# Patient Record
Sex: Male | Born: 1945
Health system: Southern US, Community
[De-identification: ages and names within clinical notes are randomized; demographics above are authoritative.]

## PROBLEM LIST (undated history)

## (undated) DIAGNOSIS — F419 Anxiety disorder, unspecified: Secondary | ICD-10-CM

## (undated) DIAGNOSIS — R7301 Impaired fasting glucose: Secondary | ICD-10-CM

## (undated) DIAGNOSIS — R945 Abnormal results of liver function studies: Secondary | ICD-10-CM

## (undated) DIAGNOSIS — F1020 Alcohol dependence, uncomplicated: Secondary | ICD-10-CM

## (undated) DIAGNOSIS — K648 Other hemorrhoids: Secondary | ICD-10-CM

## (undated) DIAGNOSIS — K7031 Alcoholic cirrhosis of liver with ascites: Secondary | ICD-10-CM

## (undated) DIAGNOSIS — K5790 Diverticulosis of intestine, part unspecified, without perforation or abscess without bleeding: Secondary | ICD-10-CM

## (undated) DIAGNOSIS — IMO0001 Reserved for inherently not codable concepts without codable children: Secondary | ICD-10-CM

## (undated) DIAGNOSIS — D126 Benign neoplasm of colon, unspecified: Secondary | ICD-10-CM

## (undated) DIAGNOSIS — T7840XA Allergy, unspecified, initial encounter: Secondary | ICD-10-CM

## (undated) DIAGNOSIS — R7989 Other specified abnormal findings of blood chemistry: Secondary | ICD-10-CM

## (undated) HISTORY — PX: WRIST FRACTURE SURGERY: SHX121

## (undated) HISTORY — DX: Benign neoplasm of colon, unspecified: D12.6

## (undated) HISTORY — PX: POLYPECTOMY: SHX149

## (undated) HISTORY — PX: UPPER GASTROINTESTINAL ENDOSCOPY: SHX188

## (undated) HISTORY — PX: HERNIA REPAIR: SHX51

## (undated) HISTORY — DX: Diverticulosis of intestine, part unspecified, without perforation or abscess without bleeding: K57.90

## (undated) HISTORY — DX: Alcoholic cirrhosis of liver with ascites: K70.31

## (undated) HISTORY — DX: Allergy, unspecified, initial encounter: T78.40XA

## (undated) HISTORY — DX: Other hemorrhoids: K64.8

## (undated) SURGERY — EGD (ESOPHAGOGASTRODUODENOSCOPY)
Anesthesia: Moderate Sedation

---

## 2002-05-31 ENCOUNTER — Ambulatory Visit (HOSPITAL_BASED_OUTPATIENT_CLINIC_OR_DEPARTMENT_OTHER): Admission: RE | Admit: 2002-05-31 | Discharge: 2002-05-31 | Payer: Self-pay | Admitting: *Deleted

## 2009-06-05 ENCOUNTER — Encounter: Admission: RE | Admit: 2009-06-05 | Discharge: 2009-06-05 | Payer: Self-pay | Admitting: Orthopedic Surgery

## 2009-10-23 ENCOUNTER — Encounter: Payer: Self-pay | Admitting: Gastroenterology

## 2009-11-02 DIAGNOSIS — D126 Benign neoplasm of colon, unspecified: Secondary | ICD-10-CM

## 2009-11-02 HISTORY — PX: COLONOSCOPY: SHX174

## 2009-11-02 HISTORY — DX: Benign neoplasm of colon, unspecified: D12.6

## 2009-11-07 ENCOUNTER — Encounter (INDEPENDENT_AMBULATORY_CARE_PROVIDER_SITE_OTHER): Payer: Self-pay | Admitting: *Deleted

## 2009-11-12 ENCOUNTER — Encounter (INDEPENDENT_AMBULATORY_CARE_PROVIDER_SITE_OTHER): Payer: Self-pay | Admitting: *Deleted

## 2009-11-19 ENCOUNTER — Ambulatory Visit: Payer: Self-pay | Admitting: Gastroenterology

## 2009-11-28 ENCOUNTER — Ambulatory Visit: Payer: Self-pay | Admitting: Gastroenterology

## 2009-12-01 ENCOUNTER — Encounter: Payer: Self-pay | Admitting: Gastroenterology

## 2010-06-04 NOTE — Letter (Signed)
Summary: Previsit letter  [facility]  [address] [place], [place] [address]   Phone: [phone]  Fax: [phone]       11/07/2009 MRN: [id number]  [facility] [address] [place], [place]  [address]  Dear [name],  Welcome to the Gastroenterology Division at [facility].    You are scheduled to see a nurse for your pre-procedure visit on 11-14-09 at 3:30p.m. on the 3rd floor at [facility], [address]  We ask that you try to arrive at our office 15 minutes prior to your appointment time to allow for check-in.  Your nurse visit will consist of discussing your medical and surgical history, your immediate family medical history, and your medications.    Please bring a complete list of all your medications or, if you prefer, bring the medication bottles and we will list them.  We will need to be aware of both prescribed and over the counter drugs.  We will need to know exact dosage information as well.  If you are on blood thinners (Coumadin, Plavix, Aggrenox, Ticlid, etc.) please call our office today/prior to your appointment, as we need to consult with your physician about holding your medication.   Please be prepared to read and sign documents such as consent forms, a financial agreement, and acknowledgement forms.  If necessary, and with your consent, a friend or relative is welcome to sit-in on the nurse visit with you.  Please bring your insurance card so that we may make a copy of it.  If your insurance requires a referral to see a specialist, please bring your referral form from your primary care physician.  No co-pay is required for this nurse visit.     If you cannot keep your appointment, please call [phone] to cancel or reschedule prior to your appointment date.  This allows [place] the opportunity to schedule an appointment for another patient in need of care.    Thank you for choosing [facility] for your medical  needs.  We appreciate the opportunity to care for you.  Please visit [place] at our website  to learn more about our practice.                     Sincerely.                                                                                                                   The Gastroenterology Division

## 2010-06-04 NOTE — Miscellaneous (Signed)
Summary: [facility] PV  Clinical Lists Changes  Medications: Added new medication of MOVIPREP 100 GM  SOLR (PEG-KCL-NACL-NASULF-NA ASC-C) As per prep instructions. - Signed Rx of MOVIPREP 100 GM  SOLR (PEG-KCL-NACL-NASULF-NA ASC-C) As per prep instructions.;  #1 x 0;  Signed;  Entered by: Ezra Sites RN;  Authorized by: Meryl Dare MD [facility];  Method used: Electronically to [facility]  [address] [id number] [address], [place], [place]  [address], Ph: [phone] or [phone], Fax: [phone] Observations: Added new observation of NKA: T (11/19/2009 14:34)    Prescriptions: MOVIPREP 100 GM  SOLR (PEG-KCL-NACL-NASULF-NA ASC-C) As per prep instructions.  #1 x 0   Entered by:   Ezra Sites RN   Authorized by:   Meryl Dare MD [facility]   Signed by:   Ezra Sites RN on 11/19/2009   Method used:   Electronically to        [facility]  [address] [id number] ([facility]ail)       [address]       [place], [place]  [address]       Ph: [phone] or [phone]       Fax: [phone]   RxID:   [id number]

## 2010-06-04 NOTE — Letter (Signed)
Summary: [facility] Instructions  [facility]  [address] [place], [place] [address]   Phone: [phone]  Fax: [phone]       [name]    Jun 17, 1945    MRN: [id number]        Procedure Day Dorna Bloom:  Wednesday 11/28/2009     Arrival Time: 9:00 am      Procedure Time: 10:00 am     Location of Procedure:                    _x _  [facility] (4th Floor)                        PREPARATION FOR COLONOSCOPY WITH MOVIPREP   Starting 5 days prior to your procedure Friday 7/22 do not eat nuts, seeds, popcorn, corn, beans, peas,  salads, or any raw vegetables.  Do not take any fiber supplements (e.g. Metamucil, Citrucel, and Benefiber).  THE DAY BEFORE YOUR PROCEDURE         DATE: Tuesday 7/26  1.  Drink clear liquids the entire day-NO SOLID FOOD  2.  Do not drink anything colored red or purple.  Avoid juices with pulp.  No orange juice.  3.  Drink at least 64 oz. (8 glasses) of fluid/clear liquids during the day to prevent dehydration and help the prep work efficiently.  CLEAR LIQUIDS INCLUDE: Water Jello Ice Popsicles Tea (sugar ok, no milk/cream) Powdered fruit flavored drinks Coffee (sugar ok, no milk/cream) Gatorade Juice: apple, white grape, white cranberry  Lemonade Clear bullion, consomm, broth Carbonated beverages (any kind) Strained chicken noodle soup Hard Candy                             4.  In the morning, mix first dose of MoviPrep solution:    Empty 1 Pouch A and 1 Pouch B into the disposable container    Add lukewarm drinking water to the top line of the container. Mix to dissolve    Refrigerate (mixed solution should be used within 24 hrs)  5.  Begin drinking the prep at 5:00 p.m. The MoviPrep container is divided by 4 marks.   Every 15 minutes drink the solution down to the next mark (approximately 8 oz) until the full liter is complete.   6.  Follow completed prep with 16 oz of clear liquid of your choice  (Nothing red or purple).  Continue to drink clear liquids until bedtime.  7.  Before going to bed, mix second dose of MoviPrep solution:    Empty 1 Pouch A and 1 Pouch B into the disposable container    Add lukewarm drinking water to the top line of the container. Mix to dissolve    Refrigerate  THE DAY OF YOUR PROCEDURE      DATE: Wednesday 7/27  Beginning at 5:00 am (5 hours before procedure):         1. Every 15 minutes, drink the solution down to the next mark (approx 8 oz) until the full liter is complete.  2. Follow completed prep with 16 oz. of clear liquid of your choice.    3. You may drink clear liquids until 8:00 am (2 HOURS BEFORE PROCEDURE).   MEDICATION INSTRUCTIONS  Unless otherwise instructed, you should take regular prescription medications with a small sip of water   as early as possible the morning of  your procedure.          OTHER INSTRUCTIONS  You will need a responsible adult at least 66 years of age to accompany you and drive you home.   This person must remain in the waiting room during your procedure.  Wear loose fitting clothing that is easily removed.  Leave jewelry and other valuables at home.  However, you may wish to bring a book to read or  an iPod/MP3 player to listen to music as you wait for your procedure to start.  Remove all body piercing jewelry and leave at home.  Total time from sign-in until discharge is approximately 2-3 hours.  You should go home directly after your procedure and rest.  You can resume normal activities the  day after your procedure.  The day of your procedure you should not:   Drive   Make legal decisions   Operate machinery   Drink alcohol   Return to work  You will receive specific instructions about eating, activities and medications before you leave.    The above instructions have been reviewed and explained to me by   Ezra Sites RN  November 19, 2009 3:07 PM     I fully understand and can  verbalize these instructions _____________________________ Date _________

## 2010-06-04 NOTE — Procedures (Signed)
Summary: Colonoscopy  Patient: [name] Note: All result statuses are Final unless otherwise noted.  Tests: (1) Colonoscopy (COL)   COL Colonoscopy           DONE     [facility]     [address]     [place], [place]  [address]           COLONOSCOPY PROCEDURE REPORT           PATIENT:  [name], [name]  MR#:  [id number]     BIRTHDATE:  May 11, 1945, 64 yrs. old  GENDER:  male     ENDOSCOPIST:  Judie Petit T. Russella Dar, MD, [facility]     Referred by:  Kari Baars, M.D.     PROCEDURE DATE:  11/28/2009     PROCEDURE:  Colonoscopy with snare polypectomy     ASA CLASS:  Class II     INDICATIONS:  1) Routine Risk Screening     MEDICATIONS:   Fentanyl 100 mcg IV, Versed 10 mg IV     DESCRIPTION OF PROCEDURE:   After the risks benefits and     alternatives of the procedure were thoroughly explained, informed     consent was obtained.  Digital rectal exam was performed and     revealed no abnormalities.  The LB PCF-H180AL [id number] endoscope     was introduced through the anus and advanced to the cecum, which     was identified by both the appendix and ileocecal valve, without     limitations. The quality of the prep was adequate, using MoviPrep.     The instrument was then slowly withdrawn as the colon was fully     examined.     <<PROCEDUREIMAGES>>     FINDINGS:  A sessile polyp was found in the proximal transverse     colon. It was 5 mm in size. Polyp was snared without cautery.     Retrieval was successful. Two polyps were found in the sigmoid     colon. They were 4 mm in size. Polyps were snared without cautery.     Retrieval was successful.  Mild diverticulosis was found ascending     colon to descending colon. Moderate diverticulosis was found in     the sigmoid colon. This was otherwise a normal examination of the     colon.  Retroflexed views in the rectum revealed internal     hemorrhoids, moderate.  The time to cecum =  4  minutes. The scope     was then withdrawn (time =   14.5  min) from the patient and the     procedure completed.           COMPLICATIONS:  None           ENDOSCOPIC IMPRESSION:     1) 5 mm sessile polyp in the proximal transverse colon     2) 4 mm, two polyps in the sigmoid colon     3) Mild diverticulosis ascending colon to descending colon     4) Moderate diverticulosis in the sigmoid colon     5) Internal hemorrhoids           RECOMMENDATIONS:     1) Await pathology results     2) High fiber diet with liberal fluid intake.     3) If the polyps removed today are adenomatous (pre-cancerous),     you will need a repeat colonoscopy in 5 years. Otherwise you  should continue to follow colorectal cancer screening guidelines     for "routine risk" patients with colonoscopy in 10 years.     Venita Lick. Russella Dar, MD, [facility]           n.     eSIGNED:   Venita Lick. Stark at 11/28/2009 10:53 AM           [place], [id number]  Note: An exclamation mark (!) indicates a result that was not dispersed into the flowsheet. Document Creation Date: 11/28/2009 10:55 AM _______________________________________________________________________  (1) Order result status: Final Collection or observation date-time: 11/28/2009 10:47 Requested date-time:  Receipt date-time:  Reported date-time:  Referring Physician:   Ordering Physician: Claudette Head [phone]) Specimen Source:  Source: Launa Grill Order Number: [id number] Lab site:   Appended Document: Colonoscopy     Procedures Next Due Date:    Colonoscopy: 11/2014

## 2010-06-04 NOTE — Letter (Signed)
Summary: Patient Notice- Polyp Results  [facility]  [address] [place], [place] [address]   Phone: [phone]  Fax: [phone]        December 01, 2009 MRN: [id number]    [facility] [address] [place], [place]  [address]    Dear [name],  I am pleased to inform you that the colon polyp(s) removed during your recent colonoscopy was (were) found to be benign (no cancer detected) upon pathologic examination.  I recommend you have a repeat colonoscopy examination in 5 years to look for recurrent polyps, as having colon polyps increases your risk for having recurrent polyps or even colon cancer in the future.  Should you develop new or worsening symptoms of abdominal pain, bowel habit changes or bleeding from the rectum or bowels, please schedule an evaluation with either your primary care physician or with me.  Continue treatment plan as outlined the day of your exam.  Please call us if you are having persistent problems or have questions about your condition that have not been fully answered at this time.  Sincerely,  Meryl Dare MD [facility]  This letter has been electronically signed by your physician.  Appended Document: Patient Notice- Polyp Results Letter mailed 8.3.2011

## 2010-09-20 NOTE — Op Note (Signed)
[name], [name]                          ACCOUNT NO.:  [id number]   MEDICAL RECORD NO.:  [id number]                   PATIENT TYPE:  AMB   LOCATION:  DSC                                  FACILITY:  [facility]   PHYSICIAN:  [name], M.D.               DATE OF BIRTH:  1945-05-17   DATE OF PROCEDURE:  05/31/2002  DATE OF DISCHARGE:                                 OPERATIVE REPORT   PREOPERATIVE DIAGNOSIS:  Right inguinal hernia.   POSTOPERATIVE DIAGNOSIS:  Right inguinal hernia.   OPERATION/PROCEDURE:  Right inguinal herniorrhaphy with mesh.   ANESTHESIA:  Local, (1% Xylocaine without epinephrine, 0.25% Marcaine  without epinephrine and sodium bicarbonate) with anesthesia monitoring.   ANESTHESIOLOGIST:  CRNA.   DESCRIPTION OF PROCEDURE:  The patient was taken to the operating room and  placed on the table in the supine position.  The right lower quadrant of the  abdomen was prepped and draped in a sterile field.  An oblique incision was  outlined with the skin marker.  The local anesthetic was used to block the  ileoinguinal nerve in the area for the incision and then was used throughout  as necessary.  Once the ileoinguinal nerve was identified, it was blocked  directly.  The incision was taken through the skin and subcutaneous tissue  and subcutaneous bleeders then cauterized with the Bovie and ligated with 3-  0 Vicryl.  The external oblique aponeurosis was divided in line with its  fibers to and through the external ring.  The patient had a large  ileoinguinal nerve which was seen and protected.  He had a smaller nerve  more medially and superiorly which was also protected.  The cord structures  were isolated with a Penrose drain.  The patient was found to have a very  large direct hernia coming off inferior to the recurrent epigastric vessels.  There was no evidence of an indirect hernia.  The large direct hernia was  reduced and the floor of the inguinal canal was  embrocated with a running  suture of 0 chromic catgut.  It was also snugged up the internal ring so  that a Kelly clamp could be inserted along the cord structures.  A piece of  3 inch x 6 inch Atria mesh was then fashioned to cover the entire inguinal  floor to extend around the cord structures superiorly and lateral to the  internal ring.  The mesh was anchored inferiorly with a running suture of 2-  0 Novofil.  The first suture was placed in the pubic tubercle.  The other  suture was placed in the shelving edge of Poupart's ligament.  The mesh was  anchored superiorly and medially with interrupted sutures of 0 Novofil.  A  slit was made into the mesh to accommodate the cord structures of the  internal ring and then the mesh was sutured to itself superior and  lateral  to the internal ring with a 3-0 Vicryl suture.  The two nerves were seen and  protected.  The mesh seemed to be lying nicely covering the inguinal floor.  The cord structures and nerves were then returned to their normal anatomical  position and the external oblique aponeurosis reapproximated with  interrupted sutures of 3-0 Vicryl.  The tip of the index finger could be  inserted along the cord structures at the external inguinal ring.  Scarpa's  fascia was closed with 3-0 Vicryl  and the skin was closed with a running subcuticular suture of 4-0 Monocryl.  Benzoin and 0.5 inch Steri-Strips were used to reinforce the skin closure.  Dry, sterile dressing was applied.  The patient seemed to have tolerated the  procedure well, was taken to the PACU in satisfactory condition.                                                [name], M.D.    TBP/MEDQ  D:  05/31/2002  T:  05/31/2002  Job:  [id number]   cc:   [name], M.D.  [address]  [place]  [place] [address]  Fax: [phone]

## 2011-01-28 ENCOUNTER — Other Ambulatory Visit: Payer: Self-pay | Admitting: Internal Medicine

## 2011-01-30 ENCOUNTER — Other Ambulatory Visit: Payer: Self-pay

## 2011-02-04 ENCOUNTER — Ambulatory Visit
Admission: RE | Admit: 2011-02-04 | Discharge: 2011-02-04 | Disposition: A | Discharge status: Home / Self Care | Payer: Medicare Other | Source: Ambulatory Visit | Attending: Internal Medicine | Admitting: Internal Medicine

## 2011-02-04 DIAGNOSIS — R7989 Other specified abnormal findings of blood chemistry: Secondary | ICD-10-CM

## 2011-05-24 ENCOUNTER — Encounter (HOSPITAL_COMMUNITY): Payer: Self-pay | Admitting: *Deleted

## 2011-05-24 ENCOUNTER — Inpatient Hospital Stay (HOSPITAL_COMMUNITY)
Admission: EM | Admit: 2011-05-24 | Discharge: 2011-05-28 | DRG: 432 | Disposition: A | Discharge status: Home Health | Payer: Medicare Other | Attending: Internal Medicine | Admitting: Internal Medicine

## 2011-05-24 ENCOUNTER — Other Ambulatory Visit: Payer: Self-pay

## 2011-05-24 DIAGNOSIS — Z23 Encounter for immunization: Secondary | ICD-10-CM

## 2011-05-24 DIAGNOSIS — F1020 Alcohol dependence, uncomplicated: Secondary | ICD-10-CM | POA: Diagnosis present

## 2011-05-24 DIAGNOSIS — K7030 Alcoholic cirrhosis of liver without ascites: Secondary | ICD-10-CM | POA: Diagnosis not present

## 2011-05-24 DIAGNOSIS — E46 Unspecified protein-calorie malnutrition: Secondary | ICD-10-CM

## 2011-05-24 DIAGNOSIS — K828 Other specified diseases of gallbladder: Secondary | ICD-10-CM | POA: Diagnosis not present

## 2011-05-24 DIAGNOSIS — R195 Other fecal abnormalities: Secondary | ICD-10-CM | POA: Diagnosis present

## 2011-05-24 DIAGNOSIS — R627 Adult failure to thrive: Secondary | ICD-10-CM | POA: Diagnosis present

## 2011-05-24 DIAGNOSIS — R188 Other ascites: Secondary | ICD-10-CM | POA: Diagnosis not present

## 2011-05-24 DIAGNOSIS — F17200 Nicotine dependence, unspecified, uncomplicated: Secondary | ICD-10-CM | POA: Diagnosis present

## 2011-05-24 DIAGNOSIS — R197 Diarrhea, unspecified: Secondary | ICD-10-CM | POA: Diagnosis present

## 2011-05-24 DIAGNOSIS — K2990 Gastroduodenitis, unspecified, without bleeding: Secondary | ICD-10-CM | POA: Diagnosis present

## 2011-05-24 DIAGNOSIS — K589 Irritable bowel syndrome without diarrhea: Secondary | ICD-10-CM | POA: Diagnosis present

## 2011-05-24 DIAGNOSIS — K922 Gastrointestinal hemorrhage, unspecified: Secondary | ICD-10-CM

## 2011-05-24 DIAGNOSIS — F419 Anxiety disorder, unspecified: Secondary | ICD-10-CM | POA: Diagnosis present

## 2011-05-24 DIAGNOSIS — D696 Thrombocytopenia, unspecified: Secondary | ICD-10-CM | POA: Diagnosis present

## 2011-05-24 DIAGNOSIS — K8020 Calculus of gallbladder without cholecystitis without obstruction: Secondary | ICD-10-CM | POA: Diagnosis present

## 2011-05-24 DIAGNOSIS — E43 Unspecified severe protein-calorie malnutrition: Secondary | ICD-10-CM | POA: Diagnosis present

## 2011-05-24 DIAGNOSIS — Z79899 Other long term (current) drug therapy: Secondary | ICD-10-CM

## 2011-05-24 DIAGNOSIS — IMO0002 Reserved for concepts with insufficient information to code with codable children: Secondary | ICD-10-CM

## 2011-05-24 DIAGNOSIS — I498 Other specified cardiac arrhythmias: Secondary | ICD-10-CM | POA: Diagnosis not present

## 2011-05-24 DIAGNOSIS — K7460 Unspecified cirrhosis of liver: Secondary | ICD-10-CM | POA: Diagnosis not present

## 2011-05-24 DIAGNOSIS — E871 Hypo-osmolality and hyponatremia: Secondary | ICD-10-CM | POA: Diagnosis present

## 2011-05-24 DIAGNOSIS — K2970 Gastritis, unspecified, without bleeding: Secondary | ICD-10-CM | POA: Diagnosis present

## 2011-05-24 DIAGNOSIS — F411 Generalized anxiety disorder: Secondary | ICD-10-CM | POA: Diagnosis present

## 2011-05-24 HISTORY — DX: Alcohol dependence, uncomplicated: F10.20

## 2011-05-24 HISTORY — DX: Anxiety disorder, unspecified: F41.9

## 2011-05-24 HISTORY — DX: Abnormal results of liver function studies: R94.5

## 2011-05-24 HISTORY — DX: Reserved for inherently not codable concepts without codable children: IMO0001

## 2011-05-24 HISTORY — DX: Impaired fasting glucose: R73.01

## 2011-05-24 HISTORY — DX: Other specified abnormal findings of blood chemistry: R79.89

## 2011-05-24 LAB — DIFFERENTIAL
Basophils Absolute: 0.1 10*3/uL (ref 0.0–0.1)
Eosinophils Absolute: 0 10*3/uL (ref 0.0–0.7)
Eosinophils Relative: 0 % (ref 0–5)
Lymphocytes Relative: 15 % (ref 12–46)
Lymphs Abs: 1.5 10*3/uL (ref 0.7–4.0)
Monocytes Absolute: 1.2 10*3/uL — ABNORMAL HIGH (ref 0.1–1.0)
Monocytes Relative: 13 % — ABNORMAL HIGH (ref 3–12)
Neutro Abs: 7.1 10*3/uL (ref 1.7–7.7)
Neutrophils Relative %: 72 % (ref 43–77)

## 2011-05-24 LAB — LACTIC ACID, PLASMA: Lactic Acid, Venous: 1.6 mmol/L (ref 0.5–2.2)

## 2011-05-24 LAB — CBC
Hemoglobin: 13.9 g/dL (ref 13.0–17.0)
MCH: 33.1 pg (ref 26.0–34.0)
RBC: 4.2 MIL/uL — ABNORMAL LOW (ref 4.22–5.81)
WBC: 9.9 10*3/uL (ref 4.0–10.5)

## 2011-05-24 LAB — COMPREHENSIVE METABOLIC PANEL
ALT: 38 U/L (ref 0–53)
AST: 78 U/L — ABNORMAL HIGH (ref 0–37)
Albumin: 2.7 g/dL — ABNORMAL LOW (ref 3.5–5.2)
Alkaline Phosphatase: 169 U/L — ABNORMAL HIGH (ref 39–117)
Calcium: 8.6 mg/dL (ref 8.4–10.5)
Total Protein: 7.1 g/dL (ref 6.0–8.3)

## 2011-05-24 LAB — OCCULT BLOOD, POC DEVICE: Fecal Occult Bld: POSITIVE

## 2011-05-24 NOTE — ED Notes (Signed)
Patient states that his appetite has been poor for 3 weeks.  He has has abdominal distention and RUQ pain for the same time frame.  Denies alcohol abuse.  States that his normal intake is 2 glasses of wine per evening.  Denies use of statins.  Denies hepatitis.

## 2011-05-24 NOTE — ED Notes (Signed)
Patient with intermittent abdominal pain for about three week.  Today his pain was worse and not getting any better.  Has been taking a lot of pepto-bismol without any relief.  Patient also c/o being weak and dizzy.  Abdomen is tight and had BM this was which was small and hard and tarry.

## 2011-05-24 NOTE — ED Notes (Signed)
EKG in event log at 2329 was a duplicate.  See other EKG for results.

## 2011-05-24 NOTE — ED Notes (Signed)
Patient reports 3 days ago noticed abdomen swelling rates burning pain 9/10. Abdomen distended denies tenderness.

## 2011-05-24 NOTE — ED Notes (Signed)
Patient has bilateral pitting edema.

## 2011-05-25 ENCOUNTER — Encounter (HOSPITAL_COMMUNITY): Payer: Self-pay | Admitting: Internal Medicine

## 2011-05-25 ENCOUNTER — Inpatient Hospital Stay (HOSPITAL_COMMUNITY): Payer: Medicare Other

## 2011-05-25 ENCOUNTER — Emergency Department (HOSPITAL_COMMUNITY): Payer: Medicare Other

## 2011-05-25 DIAGNOSIS — E46 Unspecified protein-calorie malnutrition: Secondary | ICD-10-CM

## 2011-05-25 DIAGNOSIS — R195 Other fecal abnormalities: Secondary | ICD-10-CM | POA: Diagnosis not present

## 2011-05-25 DIAGNOSIS — R899 Unspecified abnormal finding in specimens from other organs, systems and tissues: Secondary | ICD-10-CM | POA: Diagnosis not present

## 2011-05-25 DIAGNOSIS — R188 Other ascites: Secondary | ICD-10-CM | POA: Diagnosis not present

## 2011-05-25 DIAGNOSIS — F419 Anxiety disorder, unspecified: Secondary | ICD-10-CM | POA: Diagnosis present

## 2011-05-25 DIAGNOSIS — K7030 Alcoholic cirrhosis of liver without ascites: Principal | ICD-10-CM

## 2011-05-25 DIAGNOSIS — E43 Unspecified severe protein-calorie malnutrition: Secondary | ICD-10-CM | POA: Diagnosis present

## 2011-05-25 DIAGNOSIS — F1020 Alcohol dependence, uncomplicated: Secondary | ICD-10-CM | POA: Diagnosis present

## 2011-05-25 DIAGNOSIS — K7460 Unspecified cirrhosis of liver: Secondary | ICD-10-CM | POA: Diagnosis not present

## 2011-05-25 LAB — URINALYSIS, ROUTINE W REFLEX MICROSCOPIC
Hgb urine dipstick: NEGATIVE
Ketones, ur: 15 mg/dL — AB
Nitrite: POSITIVE — AB
Protein, ur: NEGATIVE mg/dL
Specific Gravity, Urine: 1.021 (ref 1.005–1.030)
Urobilinogen, UA: 1 mg/dL (ref 0.0–1.0)
pH: 5.5 (ref 5.0–8.0)

## 2011-05-25 LAB — COMPREHENSIVE METABOLIC PANEL
AST: 62 U/L — ABNORMAL HIGH (ref 0–37)
CO2: 24 mEq/L (ref 19–32)
Calcium: 7.9 mg/dL — ABNORMAL LOW (ref 8.4–10.5)
Creatinine, Ser: 0.39 mg/dL — ABNORMAL LOW (ref 0.50–1.35)
GFR calc Af Amer: >90 mL/min (ref >90)
GFR calc non Af Amer: >90 mL/min (ref >90)
Total Protein: 5.9 g/dL — ABNORMAL LOW (ref 6.0–8.3)

## 2011-05-25 LAB — BODY FLUID CELL COUNT WITH DIFFERENTIAL: Neutrophil Count, Fluid: 15 % (ref 0–25)

## 2011-05-25 LAB — CBC
Hemoglobin: 11.9 g/dL — ABNORMAL LOW (ref 13.0–17.0)
MCH: 32.5 pg (ref 26.0–34.0)
RBC: 3.66 MIL/uL — ABNORMAL LOW (ref 4.22–5.81)

## 2011-05-25 LAB — LACTATE DEHYDROGENASE, PLEURAL OR PERITONEAL FLUID: LD, Fluid: 56 U/L — ABNORMAL HIGH (ref 3–23)

## 2011-05-25 LAB — PROTEIN, BODY FLUID: Total protein, fluid: 1.6 g/dL

## 2011-05-25 LAB — HEPATITIS C ANTIBODY: HCV Ab: NEGATIVE

## 2011-05-25 LAB — URINE MICROSCOPIC-ADD ON

## 2011-05-25 LAB — FERRITIN: Ferritin: 857 ng/mL — ABNORMAL HIGH (ref 22–322)

## 2011-05-25 LAB — APTT: aPTT: 33 seconds (ref 24–37)

## 2011-05-25 LAB — TSH: TSH: 2.563 u[IU]/mL (ref 0.350–4.500)

## 2011-05-25 LAB — AMMONIA: Ammonia: 67 umol/L — ABNORMAL HIGH (ref 11–60)

## 2011-05-25 LAB — ALBUMIN, FLUID (OTHER)

## 2011-05-25 MED ORDER — SPIRONOLACTONE 25 MG PO TABS
25.0000 mg | ORAL_TABLET | Freq: Two times a day (BID) | ORAL | Status: DC
  Administered 2011-05-25 – 2011-05-28 (×6): 25 mg via ORAL
  Filled 2011-05-25 (×9): qty 1

## 2011-05-25 MED ORDER — PANTOPRAZOLE SODIUM 40 MG PO TBEC
40.0000 mg | DELAYED_RELEASE_TABLET | Freq: Two times a day (BID) | ORAL | Status: DC
  Administered 2011-05-25 – 2011-05-28 (×7): 40 mg via ORAL
  Filled 2011-05-25 (×6): qty 1

## 2011-05-25 MED ORDER — SODIUM CHLORIDE 0.9 % IJ SOLN
3.0000 mL | Freq: Two times a day (BID) | INTRAMUSCULAR | Status: DC
Start: 2011-05-25 — End: 2011-05-28
  Administered 2011-05-25 – 2011-05-28 (×6): 3 mL via INTRAVENOUS

## 2011-05-25 MED ORDER — ONDANSETRON HCL 4 MG/2ML IJ SOLN
4.0000 mg | Freq: Once | INTRAMUSCULAR | Status: AC
  Administered 2011-05-25: 4 mg via INTRAVENOUS
  Filled 2011-05-25: qty 2

## 2011-05-25 MED ORDER — ONDANSETRON HCL 4 MG/2ML IJ SOLN: 4.0000 mg | Freq: Four times a day (QID) | INTRAMUSCULAR | Status: DC | PRN

## 2011-05-25 MED ORDER — SODIUM CHLORIDE 0.9 % IV SOLN: 250.0000 mL | INTRAVENOUS | Status: DC | PRN

## 2011-05-25 MED ORDER — THIAMINE HCL 100 MG/ML IJ SOLN
INTRAVENOUS | Status: AC
  Administered 2011-05-25 – 2011-05-27 (×3): via INTRAVENOUS
  Filled 2011-05-25 (×5): qty 1000

## 2011-05-25 MED ORDER — ALPRAZOLAM 0.5 MG PO TABS
0.5000 mg | ORAL_TABLET | Freq: Three times a day (TID) | ORAL | Status: DC | PRN
  Administered 2011-05-25 – 2011-05-27 (×3): 0.5 mg via ORAL
  Filled 2011-05-25 (×3): qty 1

## 2011-05-25 MED ORDER — FENTANYL CITRATE 0.05 MG/ML IJ SOLN
25.0000 ug | Freq: Once | INTRAMUSCULAR | Status: AC
  Administered 2011-05-25: 25 ug via INTRAVENOUS
  Filled 2011-05-25: qty 2

## 2011-05-25 MED ORDER — ONDANSETRON HCL 4 MG PO TABS: 4.0000 mg | ORAL_TABLET | Freq: Four times a day (QID) | ORAL | Status: DC | PRN

## 2011-05-25 MED ORDER — SODIUM CHLORIDE 0.9 % IJ SOLN: 3.0000 mL | INTRAMUSCULAR | Status: DC | PRN

## 2011-05-25 MED ORDER — INFLUENZA VIRUS VACC SPLIT PF IM SUSP
0.5000 mL | INTRAMUSCULAR | Status: AC
  Administered 2011-05-25: 0.5 mL via INTRAMUSCULAR
  Filled 2011-05-25 (×2): qty 0.5

## 2011-05-25 MED ORDER — MORPHINE SULFATE 2 MG/ML IJ SOLN
1.0000 mg | INTRAMUSCULAR | Status: DC | PRN
  Administered 2011-05-25 (×3): 1 mg via INTRAVENOUS
  Filled 2011-05-25 (×4): qty 1

## 2011-05-25 MED ORDER — OXYCODONE HCL 5 MG PO TABS
5.0000 mg | ORAL_TABLET | ORAL | Status: DC | PRN
  Administered 2011-05-25: 5 mg via ORAL
  Filled 2011-05-25 (×2): qty 1

## 2011-05-25 NOTE — H&P (Signed)
PCP:  Kari Baars, MD  Chief Complaint:  Weakness, abdominal pain  HPI: [name] is a 66 year old white male with a history of alcoholism, anxiety disorder, and elevated liver function test who presented to the emergency department today with the complaint of abdominal pain and weakness. The patient reports severe diarrhea over the past 4-6 months which has been debilitating. He has noticed an increase in generalized weakness and unsteady gait associated with this. About 3 weeks ago he noticed a significant increase in abdominal girth associated with mild abdominal discomfort. Despite the increase in abdominal girth he has lost about 20-30 pounds. He called his daughter tonight stating that he felt like he needed to go to the hospital due to the severity of his weakness and increasing abdominal symptoms. In addition, he reports dark tarry stools off and on for the past several months. He denies any hematemesis, nausea/vomiting, fever/chills.  He states that he has been tapering his alcohol use and endorses 2-3 glasses of wine per day. Of note, his liver function tests were elevated in 9/12 (alkaline phosphatase around 300, AST around 170, and ALT around 60). He underwent an abdominal ultrasound at that time that showed fatty liver and gallstones which were uncomplicated.  He was advised to discontinue alcohol use and return for repeat liver function test in one month. Unfortunately, he has not been seen since that time and has continued to drink alcohol.    Review of Systems:  Review of Systems - all systems were reviewed with the patient are negative except in history of present illness with following exceptions: Unsteady gait when walking with recurrent falls, lower extremity swelling, bilateral leg weakness, hoarseness.    Past Medical History: Anxiety Disorder Elevated LFTs due to ETOH Elevated PSA, transient Impaired Fasting Glucose "White Coat" HTN   Medications: Prior to Admission  medications   Medication Sig Start Date End Date Taking? Authorizing Provider  ALPRAZolam Prudy Feeler) 0.5 MG tablet Take 0.5 mg by mouth 3 (three) times daily as needed. For anxiety   Yes Historical Provider, MD  bismuth subsalicylate (PEPTO BISMOL) 262 MG/15ML suspension Take 15 mLs by mouth every 6 (six) hours as needed. 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 PERRL, EOM's intact.  Nose: Nares normal. Septum midline. Mucosa normal. No drainage or sinus tenderness. Throat: lips, mucosa, and tongue normal; teeth and gums normal Neck: no adenopathy, no carotid bruit, thyroid not enlarged, symmetric, no tenderness/mass/nodules; positive hepatojugular reflux Resp: Decreased breath sounds bilateral bases Cardio: regular rate and rhythm, S1, S2 normal, no murmur, click, rub or gallop GI: Markedly distended abdomen with tense ascites; bulging flanks and shifting dullness; positive capit medusae; minimal diffuse tenderness  without rebound or guarding; no apparent hepatomegaly Extremities: extremities normal, atraumatic, no cyanosis; 2+ bilateral lower extremity pitting edema  Pulses: 2+ and symmetric Skin: Telangiectasias  over his face and trunk; ecchymoses and "liver spots" over his arms Lymph nodes: Cervical adenopathy: no cervical lymphadenopathy Neurologic: Alert and oriented X 3, normal strength and tone. Normal symmetric reflexes.  Rectal: Heme positive stool per ER physician, Dr. Dierdre Highman    Labs on Admission:   [facility] 05/24/11 2315  NA 128*  K 3.9  CL 94*  CO2 25  GLUCOSE 139*  BUN 13  CREATININE 0.48*  CALCIUM 8.6  MG --  PHOS --    Basename 05/24/11 2315  AST 78*  ALT 38  ALKPHOS 169*  BILITOT 1.9*  PROT 7.1  ALBUMIN 2.7*    Basename 05/24/11 2315  LIPASE 27  AMYLASE --    Basename 05/24/11 2315  WBC 9.9  NEUTROABS 7.1  HGB 13.9  HCT 40.4  MCV 96.2  PLT 153    No results found for this basename: INR, PROTIME    Radiological Exams on Admission: No results found. Orders placed during the hospital encounter of 05/24/11  . EKG 12-LEAD  . EKG 12-LEAD  . ED EKG  . ED EKG    Assessment/Plan Principal Problem: 1. *Cirrhosis- new onset ascites and skin findings (telangiectasias, Caput medusa) are consistent with cirrhosis, most likely due to alcohol in a patient with known chronic alcohol use and chronically elevated liver function tests (AST>ALT).  Will obtain hepatitis B and C serologies, ANA, and iron profile to rule out other less likely etiologies.  Abdominal ultrasound has been obtained and is pending at this time. Consider CT scan for further evaluation. We'll plan on ultrasound-guided paracentesis for diagnostic and therapeutic purposes in the morning. We'll consult GI to help with management and to rule out esophageal varices in the setting of his heme positive stools. He does not have any evidence of encephalopathy but we'll check an ammonia level. INR to rule out coagulopathy is also pending.   Active Problems: 2. Ascites- we'll perform diagnostic and therapeutic paracentesis in the morning. We'll start low-dose spironolactone 25 mg twice a day and consider  Lasix for fluid management. 3. Heme positive stool- we'll start on a PPI twice a day. GI consult to consider endoscopy to rule out varices. 4. Alcoholism-  Serum alcohol level is pending at this time.  Will place on CIWA protocol due to high risk of alcohol withdrawal. I have strongly advised him to cease all alcohol use. We'll give daily "rally pack" vitamin supplementation. 5. Diarrhea- maybe do to pancreatic insufficiency. We'll monitor and consider Pancrease supplementation. 6. Anxiety disorder-continue Xanax. Monitor for alcohol withdrawal. 7. Failure to thrive- PT/OT evaluation due to his recurrent fall risk. May need to consider short-term rehabilitation.   Shyloh Derosa,[name] 05/25/2011, 12:05 AM

## 2011-05-25 NOTE — ED Notes (Signed)
Patient transported to Ultrasound 

## 2011-05-25 NOTE — ED Provider Notes (Signed)
History     CSN: [id number]  Arrival date & time 05/24/11  2105   First MD Initiated Contact with Patient 05/24/11 2255      Chief Complaint  Patient presents with  . Abdominal Pain    (Consider location/radiation/quality/duration/timing/severity/associated sxs/prior treatment) The history is provided by the patient.   diffuse abdominal pain with increase for girth worse or last few weeks but much more so last 24 hours. He also notice black tarry stools today. She has history of alcoholism denies any recent alcohol abuse. He denies any fevers. He denies any history of same. Moderate in severity. Quality described as fullness and soreness. No radiation of pain. Duration intermittent until today has been constant. Moderate to severe. No nausea or vomiting. No previous treatment for the same. No known history of cirrhosis  History reviewed. No pertinent past medical history.  History reviewed. No pertinent past surgical history.  History reviewed. No pertinent family history.  History  Substance Use Topics  . Smoking status: Current Some Day Smoker    Types: Cigarettes  . Smokeless tobacco: Not on file  . Alcohol Use: No      Review of Systems  Constitutional: Negative for fever and chills.  HENT: Negative for neck pain and neck stiffness.   Eyes: Negative for pain.  Respiratory: Negative for shortness of breath.   Cardiovascular: Negative for chest pain.  Gastrointestinal: Positive for abdominal pain and abdominal distention. Negative for anal bleeding and rectal pain.  Genitourinary: Negative for dysuria.  Musculoskeletal: Negative for back pain.  Skin: Negative for rash.  Neurological: Negative for headaches.  All other systems reviewed and are negative.    Allergies  Review of patient's allergies indicates no known allergies.  Home Medications   Current Outpatient Rx  Name Route Sig Dispense Refill  . ALPRAZOLAM 0.5 MG PO TABS Oral Take 0.5 mg by mouth 3  (three) times daily as needed. For anxiety    . BISMUTH SUBSALICYLATE 262 MG/15ML PO SUSP Oral Take 15 mLs by mouth every 6 (six) hours as needed. For upset stomach      BP 133/92  Pulse 107  Temp(Src) 97.8 F (36.6 C) (Oral)  Resp 19  SpO2 100%  Physical Exam  Constitutional: He is oriented to person, place, and time. He appears well-developed and well-nourished.  HENT:  Head: Normocephalic and atraumatic.  Eyes: Conjunctivae and EOM are normal. Pupils are equal, round, and reactive to light. No scleral icterus.  Neck: Trachea normal. Neck supple. No thyromegaly present.  Cardiovascular: Normal rate, regular rhythm, S1 normal, S2 normal and normal pulses.     No systolic murmur is present   No diastolic murmur is present  Pulses:      Radial pulses are 2+ on the right side, and 2+ on the left side.  Pulmonary/Chest: Effort normal and breath sounds normal. He has no wheezes. He has no rhonchi. He has no rales. He exhibits no tenderness.  Abdominal: Normal appearance. There is no CVA tenderness and negative Murphy's sign.       Abdomen distended with ascites fluid wave present. Mild diffuse tenderness. Hypoactive bowel sounds. No guarding or peritonitis  Musculoskeletal:       BLE:s Calves nontender, no cords or erythema, negative Homans sign  Neurological: He is alert and oriented to person, place, and time. He has normal strength. No cranial nerve deficit or sensory deficit. GCS eye subscore is 4. GCS verbal subscore is 5. GCS motor subscore is 6.  Skin:  Skin is warm and dry. No rash noted. He is not diaphoretic.  Psychiatric: His speech is normal.       Cooperative and appropriate    ED Course  Procedures (including critical care time)  Results for orders placed during the hospital encounter of 05/24/11  CBC      Component Value Range   WBC 9.9  4.0 - 10.5 (K/uL)   RBC 4.20 (*) 4.22 - 5.81 (MIL/uL)   Hemoglobin 13.9  13.0 - 17.0 (g/dL)   HCT 16.1  09.6 - 04.5 (%)   MCV  96.2  78.0 - 100.0 (fL)   MCH 33.1  26.0 - 34.0 (pg)   MCHC 34.4  30.0 - 36.0 (g/dL)   RDW 40.9  81.1 - 91.4 (%)   Platelets 153  150 - 400 (K/uL)  DIFFERENTIAL      Component Value Range   Neutrophils Relative 72  43 - 77 (%)   Neutro Abs 7.1  1.7 - 7.7 (K/uL)   Lymphocytes Relative 15  12 - 46 (%)   Lymphs Abs 1.5  0.7 - 4.0 (K/uL)   Monocytes Relative 13 (*) 3 - 12 (%)   Monocytes Absolute 1.2 (*) 0.1 - 1.0 (K/uL)   Eosinophils Relative 0  0 - 5 (%)   Eosinophils Absolute 0.0  0.0 - 0.7 (K/uL)   Basophils Relative 1  0 - 1 (%)   Basophils Absolute 0.1  0.0 - 0.1 (K/uL)  COMPREHENSIVE METABOLIC PANEL      Component Value Range   Sodium 128 (*) 135 - 145 (mEq/L)   Potassium 3.9  3.5 - 5.1 (mEq/L)   Chloride 94 (*) 96 - 112 (mEq/L)   CO2 25  19 - 32 (mEq/L)   Glucose, Bld 139 (*) 70 - 99 (mg/dL)   BUN 13  6 - 23 (mg/dL)   Creatinine, Ser 7.82 (*) 0.50 - 1.35 (mg/dL)   Calcium 8.6  8.4 - 95.6 (mg/dL)   Total Protein 7.1  6.0 - 8.3 (g/dL)   Albumin 2.7 (*) 3.5 - 5.2 (g/dL)   AST 78 (*) 0 - 37 (U/L)   ALT 38  0 - 53 (U/L)   Alkaline Phosphatase 169 (*) 39 - 117 (U/L)   Total Bilirubin 1.9 (*) 0.3 - 1.2 (mg/dL)   GFR calc non Af Amer >90  >90 (mL/min)   GFR calc Af Amer >90  >90 (mL/min)  LIPASE, BLOOD      Component Value Range   Lipase 27  11 - 59 (U/L)  LACTIC ACID, PLASMA      Component Value Range   Lactic Acid, Venous 1.6  0.5 - 2.2 (mmol/L)  OCCULT BLOOD, POC DEVICE      Component Value Range   Fecal Occult Bld POSITIVE    PROTIME-INR      Component Value Range   Prothrombin Time 16.3 (*) 11.6 - 15.2 (seconds)   INR 1.29  0.00 - 1.49   APTT      Component Value Range   aPTT 33  24 - 37 (seconds)  ETHANOL      Component Value Range   Alcohol, Ethyl (B) <11  0 - 11 (mg/dL)     Case d/w Dr Clelia Croft who knows patient and will eval in ED for admit new onset ascites, ABD pain and GI bleeding.   Date: 05/25/2011  Rate: 109  Rhythm: sinus tachycardia  QRS Axis:  normal  Intervals: normal  ST/T Wave abnormalities: nonspecific ST/T changes  Conduction Disutrbances:Incomplete right bundle-branch block  Narrative Interpretation:   Old EKG Reviewed: none available    MDM   Abdominal pain with new onset ascites and a history of alcoholism likely cirrhosis. Labs obtained and reviewed as above. Primary care physician Dr. Clelia Croft involved in evaluated patient in the ED for admission.        Sunnie Nielsen, MD 05/25/11 [phone]

## 2011-05-25 NOTE — Consult Note (Signed)
[name] Consultation  Requesting Provider: Kari Baars, MD Primary Care Physician:  Kari Baars, MD Primary Gastroenterologist:  Dr. Claudette Head  Reason for Consultation:  Heme positive stool in the setting of alcoholic cirrhosis  Assessment:  Heme + stool, no anemia or clear bleeding (dark stools probably     Pepto-Bismol) Cause could be hemorrhoids seen on colonoscopy     11/2009 but he could also have portal gastropathy or varices     Alcoholic  cirrhosis - MELD = 11 with close to 100%       three-month survival. Minimal hepatic encephalopathy possible.     Elevated ferritin and iron sat 32% consistent with alcoholic disease. He    has significant ascites. Studies are pending. He has hyponatremia as    well.     Malnourished moderate to severe     I suspect chronic loose stools are from alcohol and possibly IBS   Recommendations: EGD is reasonable to evaluate heme + stool and check for varices will    keep NPO and likely be able to schedule tomorrow with Dr. Marina Goodell The    risks and benefits as well as alternatives of endoscopic procedure(s) have    been discussed and reviewed. All questions answered. The patient agrees to    proceed.       He should have Hepatitis A IgG testing and Hepatitis B S Ab      testing and if not immune then should receive vaccinations given liver    disease - I ordered           Check B12  Levels - is getting folic acid supplements      May benefit from Xifaxan for ? Minimal encephalopathy will reassess    and decide, would avoid lactulose now with loose stools     He seems to have stopped alcohol but a rehab program would make    sense if he will go. I have explained importance of cessation and     increased health risks of cirrhosis     Spironolactone started. Hyponatremia may limit the use of that. Close    followup will be needed. Sodium restriction is in diet.        No signs of tumor on ultrasound but alpha-fetoprotein is  appropriate.        HPI: [name] is a 66 y.o. White admitted yesterday with increasing abdominal girth and lower extremity edema and diffuse weakness. He has a long history of alcohol use. He was having loose stools and diarrhea for several months. He tells me he quit drinking about one month ago. The diarrhea has improved some since then. Stools have been dark but we has been using Pepto-Bismol at times. He has lost 20-30 pounds despite the increase in abdominal girth and edema. He has not had any rectal bleeding or hematemesis or nausea or vomiting or fevers. He told Dr. Clelia Croft that he was drinking 2-3 glasses of wine a day recently. Alcohol level was not detectable on admission. He was seen in September of last year by Dr. Clelia Croft and was told to stop drinking, an ultrasound showed fatty liver and gallstones then. That was his last contact with Dr. Clelia Croft. Today he underwent a 4 L paracentesis, he feels much better, the results of that are pending at this time. He remained weak.   Past Medical History  Diagnosis Date  . Elevated liver function tests   . Anxiety disorder   . Alcoholism   .  White coat hypertension   . Impaired fasting glucose     Past Surgical History  Procedure Date  . Hernia repair     Prior to Admission medications   Medication Sig Start Date End Date Taking? Authorizing Provider  ALPRAZolam Prudy Feeler) 0.5 MG tablet Take 0.5 mg by mouth 3 (three) times daily as needed. For anxiety   Yes Historical Provider, MD  bismuth subsalicylate (PEPTO BISMOL) 262 MG/15ML suspension Take 15 mLs by mouth every 6 (six) hours as needed. For upset stomach   Yes Historical Provider, MD    Current Facility-Administered Medications  Medication Dose Route Frequency Provider Last Rate Last Dose  . 0.9 %  sodium chloride infusion  250 mL Intravenous PRN Kari Baars, MD      . ALPRAZolam Prudy Feeler) tablet 0.5 mg  0.5 mg Oral TID PRN Kari Baars, MD   0.5 mg at 05/25/11 0318  . fentaNYL  (SUBLIMAZE) injection 25 mcg  25 mcg Intravenous Once Sunnie Nielsen, MD   25 mcg at 05/25/11 0015  . influenza  inactive virus vaccine (FLUZONE/FLUARIX) injection 0.5 mL  0.5 mL Intramuscular Tomorrow-[address] Buren Kos, MD      . morphine 2 MG/ML injection 1 mg  1 mg Intravenous Q4H PRN Kari Baars, MD   1 mg at 05/25/11 0443  . ondansetron (ZOFRAN) injection 4 mg  4 mg Intravenous Once Sunnie Nielsen, MD   4 mg at 05/25/11 0009  . ondansetron (ZOFRAN) tablet 4 mg  4 mg Oral Q6H PRN Kari Baars, MD       Or  . ondansetron [facility]) injection 4 mg  4 mg Intravenous Q6H PRN Kari Baars, MD      . oxyCODONE (Oxy IR/ROXICODONE) immediate release tablet 5 mg  5 mg Oral Q4H PRN Kari Baars, MD   5 mg at 05/25/11 0318  . pantoprazole (PROTONIX) EC tablet 40 mg  40 mg Oral BID AC Kari Baars, MD   40 mg at 05/25/11 0848  . sodium chloride 0.9 % 1,000 mL with thiamine 100 mg, folic acid 1 mg, multivitamins adult 10 mL infusion   Intravenous Q24H Kari Baars, MD 50 mL/hr at 05/25/11 0318    . sodium chloride 0.9 % injection 3 mL  3 mL Intravenous Q12H W Buren Kos, MD   3 mL at 05/25/11 1131  . sodium chloride 0.9 % injection 3 mL  3 mL Intravenous PRN Kari Baars, MD      . spironolactone (ALDACTONE) tablet 25 mg  25 mg Oral BID Kari Baars, MD   25 mg at 05/25/11 0848    Allergies as of 05/24/2011  . (No Known Allergies)    Family hx Father- Lymphoma (d91)  Mother- d71 AD  GM- CHF  Bro- healthy  Daughter- Melanoma X 2  Daughter- healthy  History   Social History  . Marital Status: Married lives alone    Spouse Name: N/A    Number of Children: 2  . Years of Education: Masters   Occupational History  . retired     Audiological scientist   Social History Main Topics  . Smoking status: Current Some Day Smoker -- 0.2 packs/day for 40 years    Types: Cigarettes  . Smokeless tobacco: Never Used  . Alcohol Use: 1.8 oz/week    14-21 Drinks containing 0.5 oz  of alcohol, 3 Glasses of wine per week  . Drug Use: No  . 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 No fevers, chills. Mild cough, voice change. All other review of systems negative except as mentioned in the HPI.  Physical Exam: Vital signs in last 24 hours: Temp:  [97.8 F (36.6 C)-98.2 F (36.8 C)] 98 F (36.7 C) (01/20 0857) Pulse Rate:  [66-111] 104  (01/20 0857) Resp:  [18-21] 20  (01/20 0857) BP: (97-133)/(59-92) 102/59 mmHg (01/20 1048) SpO2:  [94 %-100 %] 95 % (01/20 1048) Weight:  [154 lb 8.7 oz (70.1 kg)] 154 lb 8.7 oz (70.1 kg) (01/20 0207) Last BM Date: 05/23/11 General:   Chronically ill and gaunt appearing elderly white man Eyes:  Sclera clear, no icterus.   Conjunctiva pink. Mouth:  Oropharynx pink & moist. Teeth in poor condition, some missing, those remaining in bed repair Neck:  Supple; no masses or thyromegaly. Lungs:  Decreased breath sounds at the right base otherwise clear  Heart:  Regular rate and rhythm; no murmurs, clicks, rubs,  or gallops. Abdomen:  Soft, he is just status post 4 L paracentesis. Ascites still present and moderate amount. Prominent veins in the abdominal wall. There is no hernia though there is faint erythema around the superior aspect the umbilicus. No hepatosplenomegaly detected. Nontender. Bowel sounds are present. Right inguinal hernia scar it present. Rectal:   Inspection of the perianal area shows some brown stool around the anus. Extremities:  Plus bilateral lower extremity edema extends to the mid pretibial area. Neurologic:  Alert and  oriented x 3, speech is slow but overall fairly clear. No  asterixis. Skin:  Brown hyperpigmented spots in the pretibial area, spider angiomata in the upper trunk Lymph Nodes:  No significant cervical or supraclavicular adenopathy. Psych: Flat affect Musculoskeletal: Diffuse muscle wasting in the extremities and upper chest wall. He looks gaunt. He does not have gynecomastia  Intake/Output from previous day: 01/19 0701 - 01/20 0700 In: 305 [P.O.:120; I.V.:185] Out: 0    Lab Results:  Basename 05/25/11 0300 05/24/11 2315  WBC 9.0 9.9  HGB 11.9* 13.9  HCT 35.0* 40.4  PLT 136* 153   BMET  Basename 05/25/11 0300 05/24/11 2315  NA 128* 128*  K 3.6 3.9  CL 96 94*  CO2 24 25  GLUCOSE 142* 139*  BUN 13 13  CREATININE 0.39* 0.48*  CALCIUM 7.9* 8.6   LFT  Basename 05/25/11 0300  PROT 5.9*  ALBUMIN 2.1*  AST 62*  ALT 30  ALKPHOS 138*  BILITOT 1.5*  BILIDIR --  IBILI --   PT/INR  Basename 05/25/11 0001  LABPROT 16.3*  INR 1.29      Studies/Results: US Abdomen Complete  05/25/2011  *RADIOLOGY REPORT*  Clinical Data:  Abdominal distention.  History of fatty liver. Evaluate liver.  COMPLETE ABDOMINAL ULTRASOUND  Comparison:  02/04/2011  Findings:  Gallbladder:  Multiple echogenic foci within the gallbladder compatible with small stones.  Bladder wall is thickened at 6 mm. Negative sonographic Murphy's.  Common bile duct:   Normal, 4 mm.  Liver:  Increased echotexture throughout the liver.  Nodular contours to the liver surface.  Findings compatible with cirrhosis. Associated moderate ascites.  No biliary ductal dilatation.  IVC:  Not well visualized due to overlying bowel gas.  Pancreas:  Not well visualized due to overlying bowel gas.  Spleen:  Normal size, 8 cm in craniocaudal length.  Right Kidney:   Normal in size and parenchymal echogenicity.  No evidence of mass or hydronephrosis.  Left Kidney:  Normal in size and parenchymal echogenicity.  No evidence of mass or hydronephrosis.  Abdominal aorta:  Not visualized due to  overlying bowel gas.  IMPRESSION: Changes of cirrhosis within the liver.  Associated moderate ascites.  Cholelithiasis.  Gallbladder wall thickening may be related to liver disease, chronic cholecystitis, or a combination.  No changes of acute cholecystitis.  Midline structures difficult to visualize due to overlying bowel gas.  Original Report Authenticated By: Cyndie Chime, M.D.   US Paracentesis  05/25/2011  *RADIOLOGY REPORT*  Clinical Data: Cirrhosis and ascites.  ULTRASOUND GUIDED PARACENTESIS  Comparison:  05/25/2011  An ultrasound guided paracentesis was thoroughly discussed with the patient and questions answered.  The benefits, risks, alternatives and complications were also discussed.  The patient understands and wishes to proceed with the procedure.  Written consent was obtained.  Ultrasound was performed to localize and mark an adequate pocket of fluid in the right lower quadrant of the abdomen.  The area was then prepped and draped in the normal sterile fashion.  1% Lidocaine was used for local anesthesia.  Under ultrasound guidance a 19 gauge Yueh catheter was introduced.  Paracentesis was performed.  The catheter was removed and a dressing applied.  Complications:  None  Findings:  A total of approximately 4.18 liters of clear yellow fluid was removed.  A fluid sample was sent for laboratory analysis.  IMPRESSION: Successful ultrasound guided paracentesis yielding 4.18 liters of ascites.  Original Report Authenticated By: Richarda Overlie, M.D.     Previous Endoscopies: 11/2009 colonoscopy 1) 5 mm sessile polyp in the proximal transverse colon - adenoma 2) 4 mm, two polyps in the sigmoid colon - adenomas 3) Mild diverticulosis ascending colon to descending colon  4) Moderate diverticulosis in the sigmoid colon  5) Internal hemorrhoids     LOS: 1 day      Iva Boop, MD, [facility] [phone] (pager) 05/25/2011 12:00 PM

## 2011-05-25 NOTE — ED Notes (Signed)
Advised MD of hyponatremia.

## 2011-05-25 NOTE — Procedures (Signed)
US guided paracentesis.  Removed 4.18 liters of fluid.

## 2011-05-25 NOTE — ED Notes (Signed)
Called and gave report to [place].  Advised patient has been unable to provide a urine sample.

## 2011-05-25 NOTE — Progress Notes (Signed)
Subjective: Feels better.  Still feels like abdomen is tense.    Objective: Vital signs in last 24 hours: Temp:  [97.8 F (36.6 C)-98.2 F (36.8 C)] 97.9 F (36.6 C) (01/20 0609) Pulse Rate:  [66-111] 98  (01/20 0609) Resp:  [18-21] 18  (01/20 0609) BP: (99-133)/(68-92) 99/68 mmHg (01/20 0609) SpO2:  [96 %-100 %] 96 % (01/20 0609) Weight:  [70.1 kg (154 lb 8.7 oz)] 70.1 kg (154 lb 8.7 oz) (01/20 0207) Weight change:     CBG (last 3)  No results found for this basename: GLUCAP:3 in the last 72 hours  Intake/Output from previous day:   Intake/Output this shift:    General appearance: chronically ill, cachectic, classic cirrhotic findings Eyes: no scleral icterus Throat: oropharynx moist without erythema Resp: decreased breath sounds bilateral bases Cardio: regular rate and rhythm, S1, S2 normal, no murmur, click, rub or gallop GI: distended with tense ascites; no rebound or guarding Extremities: no clubbing, cyanosis; 2+ bilateral lower extremity edema Neurologic: slight dysarthria, no asterixis; alert and oriented X 4 Psych: no tremor or excessive anxiety  Lab Results:  Basename 05/25/11 0300 05/24/11 2315  NA 128* 128*  K 3.6 3.9  CL 96 94*  CO2 24 25  GLUCOSE 142* 139*  BUN 13 13  CREATININE 0.39* 0.48*  CALCIUM 7.9* 8.6  MG -- --  PHOS -- --    Basename 05/25/11 0300 05/24/11 2315  AST 62* 78*  ALT 30 38  ALKPHOS 138* 169*  BILITOT 1.5* 1.9*  PROT 5.9* 7.1  ALBUMIN 2.1* 2.7*    Basename 05/25/11 0300 05/24/11 2315  WBC 9.0 9.9  NEUTROABS -- 7.1  HGB 11.9* 13.9  HCT 35.0* 40.4  MCV 95.6 96.2  PLT 136* 153   Lab Results  Component Value Date   INR 1.29 05/25/2011   ETOH level <2 Ammonia 67   Studies/Results: US Abdomen Complete  05/25/2011  *RADIOLOGY REPORT*  Clinical Data:  Abdominal distention.  History of fatty liver. Evaluate liver.  COMPLETE ABDOMINAL ULTRASOUND  Comparison:  02/04/2011  Findings:  Gallbladder:  Multiple echogenic foci  within the gallbladder compatible with small stones.  Bladder wall is thickened at 6 mm. Negative sonographic Murphy's.  Common bile duct:   Normal, 4 mm.  Liver:  Increased echotexture throughout the liver.  Nodular contours to the liver surface.  Findings compatible with cirrhosis. Associated moderate ascites.  No biliary ductal dilatation.  IVC:  Not well visualized due to overlying bowel gas.  Pancreas:  Not well visualized due to overlying bowel gas.  Spleen:  Normal size, 8 cm in craniocaudal length.  Right Kidney:   Normal in size and parenchymal echogenicity.  No evidence of mass or hydronephrosis.  Left Kidney:  Normal in size and parenchymal echogenicity.  No evidence of mass or hydronephrosis.  Abdominal aorta:  Not visualized due to overlying bowel gas.  IMPRESSION: Changes of cirrhosis within the liver.  Associated moderate ascites.  Cholelithiasis.  Gallbladder wall thickening may be related to liver disease, chronic cholecystitis, or a combination.  No changes of acute cholecystitis.  Midline structures difficult to visualize due to overlying bowel gas.  Original Report Authenticated By: Cyndie Chime, M.D.     Medications: Scheduled:   . fentaNYL  25 mcg Intravenous Once  . influenza  inactive virus vaccine  0.5 mL Intramuscular Tomorrow-1000  . ondansetron  4 mg Intravenous Once  . pantoprazole  40 mg Oral BID AC  . general admission iv infusion  Intravenous Q24H  . sodium chloride  3 mL Intravenous Q12H  . spironolactone  25 mg Oral BID   Continuous:   Assessment/Plan: Principal Problem: 1. *Cirrhosis- diagnostic/therapeutic paracentesis today.  Started spironolactone- will adjust dose as tolerated.  GI consult requested.  ANA, Hepatitis serologies, iron profile pending.  Child's Class B based on current parameters. Active Problems: 2.Ascites- paracentesis today as above.   3. Heme positive stool- GI consult pending for probable EGD to rule out varices.  Continue PPI bid. 4.  Alcoholism- ETOH level undetectable.  He reports last drink was 3 weeks ago.  Continue CIWA protocol, rally pack daily.  Reinforced importance of abstinence from ETOH and avoiding hepatotoxins. 5. Anxiety disorder- continue Xanax prn.    LOS: 1 day   Shaliyah Taite,[name] 05/25/2011, 8:15 AM

## 2011-05-26 ENCOUNTER — Encounter (HOSPITAL_COMMUNITY): Payer: Self-pay | Admitting: Internal Medicine

## 2011-05-26 ENCOUNTER — Encounter (HOSPITAL_COMMUNITY)
Admission: EM | Disposition: A | Discharge status: Home Health | Payer: Self-pay | Source: Home / Self Care | Attending: Internal Medicine

## 2011-05-26 DIAGNOSIS — R195 Other fecal abnormalities: Secondary | ICD-10-CM | POA: Diagnosis not present

## 2011-05-26 DIAGNOSIS — R188 Other ascites: Secondary | ICD-10-CM | POA: Diagnosis not present

## 2011-05-26 DIAGNOSIS — D696 Thrombocytopenia, unspecified: Secondary | ICD-10-CM | POA: Diagnosis not present

## 2011-05-26 DIAGNOSIS — E46 Unspecified protein-calorie malnutrition: Secondary | ICD-10-CM | POA: Diagnosis not present

## 2011-05-26 DIAGNOSIS — K922 Gastrointestinal hemorrhage, unspecified: Secondary | ICD-10-CM | POA: Diagnosis not present

## 2011-05-26 DIAGNOSIS — K7030 Alcoholic cirrhosis of liver without ascites: Secondary | ICD-10-CM | POA: Diagnosis not present

## 2011-05-26 HISTORY — PX: ESOPHAGOGASTRODUODENOSCOPY: SHX5428

## 2011-05-26 LAB — COMPREHENSIVE METABOLIC PANEL
ALT: 26 U/L (ref 0–53)
Alkaline Phosphatase: 136 U/L — ABNORMAL HIGH (ref 39–117)
BUN: 13 mg/dL (ref 6–23)
CO2: 24 mEq/L (ref 19–32)
GFR calc Af Amer: >90 mL/min (ref >90)
GFR calc non Af Amer: >90 mL/min (ref >90)
Glucose, Bld: 100 mg/dL — ABNORMAL HIGH (ref 70–99)
Potassium: 3.8 mEq/L (ref 3.5–5.1)
Sodium: 130 mEq/L — ABNORMAL LOW (ref 135–145)
Total Bilirubin: 1.4 mg/dL — ABNORMAL HIGH (ref 0.3–1.2)

## 2011-05-26 LAB — AFP TUMOR MARKER: AFP-Tumor Marker: 3.5 ng/mL (ref 0.0–8.0)

## 2011-05-26 LAB — CBC
MCHC: 34.5 g/dL (ref 30.0–36.0)
Platelets: 123 10*3/uL — ABNORMAL LOW (ref 150–400)
RDW: 15.5 % (ref 11.5–15.5)

## 2011-05-26 LAB — ANA: Anti Nuclear Antibody(ANA): NEGATIVE

## 2011-05-26 LAB — VITAMIN B12: Vitamin B-12: 716 pg/mL (ref 211–911)

## 2011-05-26 SURGERY — EGD (ESOPHAGOGASTRODUODENOSCOPY)
Anesthesia: Moderate Sedation

## 2011-05-26 MED ORDER — FENTANYL NICU IV SYRINGE 50 MCG/ML
INJECTION | INTRAMUSCULAR | Status: DC | PRN
  Administered 2011-05-26 (×2): 25 ug via INTRAVENOUS

## 2011-05-26 MED ORDER — ENSURE CLINICAL ST REVIGOR PO LIQD
237.0000 mL | Freq: Two times a day (BID) | ORAL | Status: DC
  Administered 2011-05-26 – 2011-05-28 (×4): 237 mL via ORAL

## 2011-05-26 MED ORDER — MIDAZOLAM HCL 10 MG/2ML IJ SOLN
INTRAMUSCULAR | Status: AC
  Filled 2011-05-26: qty 2

## 2011-05-26 MED ORDER — FENTANYL CITRATE 0.05 MG/ML IJ SOLN
INTRAMUSCULAR | Status: AC
  Filled 2011-05-26: qty 2

## 2011-05-26 MED ORDER — BUTAMBEN-TETRACAINE-BENZOCAINE 2-2-14 % EX AERO
INHALATION_SPRAY | CUTANEOUS | Status: DC | PRN
  Administered 2011-05-26: 2 via TOPICAL

## 2011-05-26 MED ORDER — MIDAZOLAM HCL 10 MG/2ML IJ SOLN
INTRAMUSCULAR | Status: DC | PRN
  Administered 2011-05-26 (×3): 2 mg via INTRAVENOUS

## 2011-05-26 MED ORDER — SODIUM CHLORIDE 0.9 % IV SOLN
Freq: Once | INTRAVENOUS | Status: AC
  Administered 2011-05-26: 12:00:00 via INTRAVENOUS

## 2011-05-26 NOTE — Progress Notes (Signed)
INITIAL ADULT NUTRITION ASSESSMENT Date: 05/26/2011   Time: 2:21 PM  Reason for Assessment: Nutrition Risk Report (unintentional wt loss)  ASSESSMENT: Male 66 y.o.  Dx: Alcoholic cirrhosis  Hx:  Past Medical History  Diagnosis Date  . Elevated liver function tests   . Anxiety disorder   . Alcoholism   . White coat hypertension   . Impaired fasting glucose    Related Meds:     . sodium chloride   Intravenous Once  . influenza  inactive virus vaccine  0.5 mL Intramuscular Tomorrow-1000  . pantoprazole  40 mg Oral BID AC  . general admission iv infusion   Intravenous Q24H  . sodium chloride  3 mL Intravenous Q12H  . spironolactone  25 mg Oral BID   Ht: 6' (182.9 cm)  Wt: 147 lb 11.3 oz (67 kg) (bed scale)  Ideal Wt: 80.9 kg % Ideal Wt: 83%  Usual Wt: 70.5 kg (per patient - "last summer") % Usual Wt: 95%  Body mass index is 20.03 kg/(m^2). Weight is WNL.  Food/Nutrition Related Hx: poor intake x 3 weeks  Labs:  CMP     Component Value Date/Time   NA 130* 05/26/2011 0610   K 3.8 05/26/2011 0610   CL 99 05/26/2011 0610   CO2 24 05/26/2011 0610   GLUCOSE 100* 05/26/2011 0610   BUN 13 05/26/2011 0610   CREATININE 0.38* 05/26/2011 0610   CALCIUM 7.6* 05/26/2011 0610   PROT 5.5* 05/26/2011 0610   ALBUMIN 2.0* 05/26/2011 0610   AST 54* 05/26/2011 0610   ALT 26 05/26/2011 0610   ALKPHOS 136* 05/26/2011 0610   BILITOT 1.4* 05/26/2011 0610   GFRNONAA >90 05/26/2011 0610   GFRAA >90 05/26/2011 0610   Intake/Output: I/O last 3 completed shifts: In: 1385 [P.O.:600; I.V.:785] Out: 575 [Urine:575] Total I/O In: 0  Out: 200 [Urine:200]  Diet Order: NPO  Supplements/Tube Feeding: none at this time  IVF:    Estimated Nutritional Needs:   Kcal:  1900 - 2100 Protein:  87 - 100 g Fluid:  per MD  RD drawn to patient 2/2 nutrition risk report, pt reports unintentional wt loss greater than 10 lb within the last month. Per H&P, pt has history of alcoholism. Severe diarrhea x 4-6  months. Pt with significant increase in abdominal girth x 3 weeks, as well as 20-30 lb wt loss. This is a 16% wt loss x 6 months. Pt reports intake has declined as abdominal girth has increased, causing increased fullness and lack of appetite. Pt meets criteria for severe malnutrition in the context of chronic illness. Note current weight is artificially high 2/2 fluid. Family reports pt ate well for dinner last night (was on low sodium diet before made NPO).  Underwent US guided paracentesis on 1/20, removing 4.18 L of fluid.  Pt states that he is hungry and ready to eat. However, remains NPO at this time.  NUTRITION DIAGNOSIS: -Inadequate oral intake (NI-2.1).  Status: Ongoing  RELATED TO: reduced appetite  AS EVIDENCE BY: pt report and unintentional wt loss.  MONITORING/EVALUATION(Goals): Goal: Pt to consume >/= 75% of meals and supplements once medically able to eat. Monitor: PO intake, weights, labs, I/O's  EDUCATION NEEDS: -Education needs addressed  INTERVENTION: 1. Provided High Calorie/Protein handout in efforts to promote weight maintenance.  2. Once able to eat, add Ensure Clinical Strength PO BID between meals. Pt is familiar with this product and would like to consume in efforts to gain weight. 3. RD to follow nutrition  care plan  Dietitian #: [phone]  DOCUMENTATION CODES Per approved criteria  -Severe malnutrition in the context of chronic illness    Adair Laundry 05/26/2011, 2:21 PM

## 2011-05-26 NOTE — Op Note (Signed)
[facility] [address] [place], [place]  [address]  ENDOSCOPY PROCEDURE REPORT  PATIENT:  [name], [name]  MR#:  [id number] BIRTHDATE:  [birth year], 65 yrs. old  GENDER:  male  ENDOSCOPIST:  Wilhemina Bonito. Eda Keys, MD Referred by:  Kari Baars, M.D.  PROCEDURE DATE:  05/26/2011 PROCEDURE:  EGD, diagnostic 43235 ASA CLASS:  Class III INDICATIONS:  hemoccult positive stool, Evaluate for esophageal varices in a patient with portal hypertension and/or cirrhosis.  MEDICATIONS:   Fentanyl 50 mcg IV, Versed 6 mg IV TOPICAL ANESTHETIC:  Cetacaine Spray  DESCRIPTION OF PROCEDURE:   After the risks benefits and alternatives of the procedure were thoroughly explained, informed consent was obtained.  The Pentax Gastroscope [id number] endoscope was introduced through the mouth and advanced to the second portion of the duodenum, without limitations.  The instrument was slowly withdrawn as the mucosa was fully examined. <<PROCEDUREIMAGES>>  The upper, middle, and distal third of the esophagus were carefully inspected and no abnormalities were noted. The z-line was well seen at the GEJ. NO VARICES.The endoscope was pushed into the fundus which was normal including a retroflexed view. The antrum,gastric body, first and second part of the duodenum were essentialy unremarkable.  Mild nonerosive gastritis was found in the antrum.    Retroflexed views revealed no abnormalities.    The scope was then withdrawn from the patient and the procedure completed.  COMPLICATIONS:  None ENDOSCOPIC IMPRESSION: 1) Essentially Normal EGD. No varices  2) Mild gastritis in the antrum  RECOMMENDATIONS: 1) Repeat screening endoscopy with Dr Russella Dar in 2 years 2) CONTINUE DIURETICS AND SODIUM RESTRICTION (2500 MG) FOR ASCITES 2) Out patient GI follow up wth Dr. Russella Dar as needed  WE ARE AVAILABLE AS NEEDED. WILL SIGN OFF  ______________________________ Wilhemina Bonito. Eda Keys, MD  CC:  Kari Baars, MD; Claudette Head, MD  n. eSIGNED:   Wilhemina Bonito. Eda Keys at 05/26/2011 01:03 PM  [place] Area, [phone]

## 2011-05-26 NOTE — Op Note (Signed)
NEGATIVE EGD. SEE PENTAX REPORT

## 2011-05-26 NOTE — Interval H&P Note (Signed)
History and Physical Interval Note:  05/26/2011 12:45 PM  [name]  has presented today for surgery, with the diagnosis of Postive hem stool  The various methods of treatment have been discussed with the patient and family. After consideration of risks, benefits and other options for treatment, the patient has consented to  Procedure(s): ESOPHAGOGASTRODUODENOSCOPY (EGD) as a surgical intervention .  The patients' history has been reviewed, patient examined, no change in status, stable for surgery.  I have reviewed the patients' chart and labs.  Questions were answered to the patient's satisfaction.     [name]

## 2011-05-26 NOTE — Progress Notes (Signed)
Subjective: Had 4.18L of fluid removed with paracentesis- volume limited by hypotension per patient report.  Feels much better.  No diarrhea.   Objective: Vital signs in last 24 hours: Temp:  [97.6 F (36.4 C)-98.1 F (36.7 C)] 98 F (36.7 C) (01/21 0447) Pulse Rate:  [92-104] 92  (01/21 0447) Resp:  [18-20] 18  (01/21 0447) BP: (97-108)/(59-72) 103/68 mmHg (01/21 0447) SpO2:  [94 %-98 %] 94 % (01/21 0447) Weight:  [67 kg (147 lb 11.3 oz)] 67 kg (147 lb 11.3 oz) (01/20 2046) Weight change: -3.1 kg (-6 lb 13.4 oz) Last BM Date: 05/23/11  CBG (last 3)  No results found for this basename: GLUCAP:3 in the last 72 hours  Intake/Output from previous day: 01/20 0701 - 01/21 0700 In: 1080 [P.O.:480; I.V.:600] Out: 575 [Urine:575] Intake/Output this shift:    General appearance: alert and no distress Eyes: no scleral icterus Throat: oropharynx moist without erythema Resp: clear to auscultation bilaterally Cardio: regular rate and rhythm, S1, S2 normal, no murmur, click, rub or gallop GI: softer, non-tender; bowel sounds normal; no masses,  no organomegaly; moderate amount of ascites remains Extremities: no clubbing, cyanosis; 1+ bilateral edema  Lab Results:  Basename 05/26/11 0610 05/25/11 0300  NA 130* 128*  K 3.8 3.6  CL 99 96  CO2 24 24  GLUCOSE 100* 142*  BUN 13 13  CREATININE 0.38* 0.39*  CALCIUM 7.6* 7.9*  MG -- --  PHOS -- --    Basename 05/26/11 0610 05/25/11 0300  AST 54* 62*  ALT 26 30  ALKPHOS 136* 138*  BILITOT 1.4* 1.5*  PROT 5.5* 5.9*  ALBUMIN 2.0* 2.1*    Basename 05/26/11 0610 05/25/11 0300 05/24/11 2315  WBC 7.3 9.0 --  NEUTROABS -- -- 7.1  HGB 12.1* 11.9* --  HCT 35.1* 35.0* --  MCV 96.2 95.6 --  PLT 123* 136* --   Lab Results  Component Value Date   INR 1.29 05/25/2011   No results found for this basename: CKTOTAL:3,CKMB:3,CKMBINDEX:3,TROPONINI:3 in the last 72 hours  Basename 05/25/11 0300  TSH 2.563  T4TOTAL --  T3FREE --    THYROIDAB --    Basename 05/25/11 0300  VITAMINB12 --  FOLATE --  FERRITIN 857*  TIBC 101*  IRON 32*  RETICCTPCT --    Studies/Results: US Abdomen Complete  05/25/2011  *RADIOLOGY REPORT*  Clinical Data:  Abdominal distention.  History of fatty liver. Evaluate liver.  COMPLETE ABDOMINAL ULTRASOUND  Comparison:  02/04/2011  Findings:  Gallbladder:  Multiple echogenic foci within the gallbladder compatible with small stones.  Bladder wall is thickened at 6 mm. Negative sonographic Murphy's.  Common bile duct:   Normal, 4 mm.  Liver:  Increased echotexture throughout the liver.  Nodular contours to the liver surface.  Findings compatible with cirrhosis. Associated moderate ascites.  No biliary ductal dilatation.  IVC:  Not well visualized due to overlying bowel gas.  Pancreas:  Not well visualized due to overlying bowel gas.  Spleen:  Normal size, 8 cm in craniocaudal length.  Right Kidney:   Normal in size and parenchymal echogenicity.  No evidence of mass or hydronephrosis.  Left Kidney:  Normal in size and parenchymal echogenicity.  No evidence of mass or hydronephrosis.  Abdominal aorta:  Not visualized due to overlying bowel gas.  IMPRESSION: Changes of cirrhosis within the liver.  Associated moderate ascites.  Cholelithiasis.  Gallbladder wall thickening may be related to liver disease, chronic cholecystitis, or a combination.  No changes of acute cholecystitis.  Midline structures difficult to visualize due to overlying bowel gas.  Original Report Authenticated By: Cyndie Chime, M.D.   US Paracentesis  05/25/2011  *RADIOLOGY REPORT*  Clinical Data: Cirrhosis and ascites.  ULTRASOUND GUIDED PARACENTESIS  Comparison:  05/25/2011  An ultrasound guided paracentesis was thoroughly discussed with the patient and questions answered.  The benefits, risks, alternatives and complications were also discussed.  The patient understands and wishes to proceed with the procedure.  Written consent was  obtained.  Ultrasound was performed to localize and mark an adequate pocket of fluid in the right lower quadrant of the abdomen.  The area was then prepped and draped in the normal sterile fashion.  1% Lidocaine was used for local anesthesia.  Under ultrasound guidance a 19 gauge Yueh catheter was introduced.  Paracentesis was performed.  The catheter was removed and a dressing applied.  Complications:  None  Findings:  A total of approximately 4.18 liters of clear yellow fluid was removed.  A fluid sample was sent for laboratory analysis.  IMPRESSION: Successful ultrasound guided paracentesis yielding 4.18 liters of ascites.  Original Report Authenticated By: Richarda Overlie, M.D.     Medications: Scheduled:   . influenza  inactive virus vaccine  0.5 mL Intramuscular Tomorrow-1000  . pantoprazole  40 mg Oral BID AC  . general admission iv infusion   Intravenous Q24H  . sodium chloride  3 mL Intravenous Q12H  . spironolactone  25 mg Oral BID   Continuous:   Assessment/Plan: Principal Problem: 1. *Alcoholic cirrhosis- continue spironolactone for ascites.  Anticipate EGD today.  Defer ?Beta Blocker to GI pending results.  Consider Xifaxin for ?mild encephalopathy (no lactulose due to ongoing diarrhea).  Active Problems: 2. Ascites- continue spironolactone- may try to increase dose if tolerated by hyponatremia and BP.  Consider repeat paracentesis (?with albumen) prior to discharge if it reaccumulates.  3. Heme positive stool- anticipate EGD today to rule out varices.  Continue PPI. 4. Alcoholism- no evidence of withdrawal.  Continue Rally pack. 5. Anxiety disorder- continue Xanax. 6. Severe protein-calorie malnutrition- encouraged po intake.  Consider supplements after EGD. 7. Disposition- anticipate discharge in 2-3 days if ascites is stable.   LOS: 2 days   Alivia Cimino,[name] 05/26/2011, 8:46 AM

## 2011-05-27 ENCOUNTER — Encounter (HOSPITAL_COMMUNITY): Payer: Self-pay | Admitting: Internal Medicine

## 2011-05-27 ENCOUNTER — Inpatient Hospital Stay (HOSPITAL_COMMUNITY): Payer: Medicare Other

## 2011-05-27 DIAGNOSIS — E46 Unspecified protein-calorie malnutrition: Secondary | ICD-10-CM | POA: Diagnosis not present

## 2011-05-27 DIAGNOSIS — D696 Thrombocytopenia, unspecified: Secondary | ICD-10-CM | POA: Diagnosis not present

## 2011-05-27 DIAGNOSIS — R188 Other ascites: Secondary | ICD-10-CM | POA: Diagnosis not present

## 2011-05-27 LAB — PATHOLOGIST SMEAR REVIEW: Tech Review: REACTIVE

## 2011-05-27 LAB — COMPREHENSIVE METABOLIC PANEL
ALT: 28 U/L (ref 0–53)
AST: 59 U/L — ABNORMAL HIGH (ref 0–37)
Albumin: 2.1 g/dL — ABNORMAL LOW (ref 3.5–5.2)
Alkaline Phosphatase: 149 U/L — ABNORMAL HIGH (ref 39–117)
BUN: 16 mg/dL (ref 6–23)
Chloride: 99 mEq/L (ref 96–112)
Potassium: 3.9 mEq/L (ref 3.5–5.1)
Sodium: 131 mEq/L — ABNORMAL LOW (ref 135–145)
Total Bilirubin: 1.2 mg/dL (ref 0.3–1.2)

## 2011-05-27 LAB — CBC
MCH: 33.1 pg (ref 26.0–34.0)
MCHC: 34.8 g/dL (ref 30.0–36.0)
MCV: 94.9 fL (ref 78.0–100.0)
Platelets: 137 10*3/uL — ABNORMAL LOW (ref 150–400)
RDW: 15.4 % (ref 11.5–15.5)
WBC: 9.8 10*3/uL (ref 4.0–10.5)

## 2011-05-27 LAB — BODY FLUID CELL COUNT WITH DIFFERENTIAL
Lymphs, Fluid: 42 %
Monocyte-Macrophage-Serous Fluid: 54 % (ref 50–90)

## 2011-05-27 MED ORDER — BISMUTH SUBSALICYLATE 262 MG/15ML PO SUSP
30.0000 mL | ORAL | Status: DC | PRN
  Filled 2011-05-27: qty 236

## 2011-05-27 MED ORDER — ALBUMIN HUMAN 5 % IV SOLN
12.5000 g | Freq: Once | INTRAVENOUS | Status: AC
  Administered 2011-05-27: 12.5 g via INTRAVENOUS
  Filled 2011-05-27 (×2): qty 250

## 2011-05-27 NOTE — Progress Notes (Signed)
Subjective: Complains of mild sour stomach.  Abdominal distention increased slightly.    Objective: Vital signs in last 24 hours: Temp:  [97.3 F (36.3 C)-98.2 F (36.8 C)] 98.2 F (36.8 C) (01/22 0408) Pulse Rate:  [79-99] 99  (01/22 0408) Resp:  [16-18] 18  (01/22 0408) BP: (91-128)/(51-76) 108/74 mmHg (01/22 0408) SpO2:  [95 %-100 %] 95 % (01/22 0408) Weight change:  Last BM Date: 05/26/11  CBG (last 3)  No results found for this basename: GLUCAP:3 in the last 72 hours  Intake/Output from previous day: 01/21 0701 - 01/22 0700 In: 240 [P.O.:240] Out: 452 [Urine:451; Stool:1] Intake/Output this shift: Total I/O In: -  Out: 251 [Urine:250; Stool:1]  General appearance: alert and frail Eyes: no scleral icterus Throat: oropharynx moist without erythema Resp: clear to auscultation bilaterally Cardio: regular rate and rhythm, S1, S2 normal, no murmur, click, rub or gallop GI: increased ascites.  Nontender, normal bowel sounds Extremities: no clubbing, cyanosis or edema   Lab Results:  Basename 05/26/11 0610 05/25/11 0300  NA 130* 128*  K 3.8 3.6  CL 99 96  CO2 24 24  GLUCOSE 100* 142*  BUN 13 13  CREATININE 0.38* 0.39*  CALCIUM 7.6* 7.9*  MG -- --  PHOS -- --    Basename 05/26/11 0610 05/25/11 0300  AST 54* 62*  ALT 26 30  ALKPHOS 136* 138*  BILITOT 1.4* 1.5*  PROT 5.5* 5.9*  ALBUMIN 2.0* 2.1*    Basename 05/26/11 0610 05/25/11 0300 05/24/11 2315  WBC 7.3 9.0 --  NEUTROABS -- -- 7.1  HGB 12.1* 11.9* --  HCT 35.1* 35.0* --  MCV 96.2 95.6 --  PLT 123* 136* --   Lab Results  Component Value Date   INR 1.29 05/25/2011    Basename 05/25/11 0300  TSH 2.563  T4TOTAL --  T3FREE --  THYROIDAB --    Basename 05/26/11 0610 05/25/11 0300  VITAMINB12 716 --  FOLATE -- --  FERRITIN -- 857*  TIBC -- 101*  IRON -- 32*  RETICCTPCT -- --   Peritoneal fluid analysis (transudative): Albumen 0.8 (SAAG 1.3) Protein 1.6 WBC 134 (74 lymphs, 15 PMNs, 11  monos) Cytology negative   ANA negative Hepatitis A antibody negative Hepatitis B surface antibody/surface antigen negative Hepatitis C antibody negative Alpha Fetoprotein 3.5 (normal)  Studies/Results: US Paracentesis  05/25/2011  *RADIOLOGY REPORT*  Clinical Data: Cirrhosis and ascites.  ULTRASOUND GUIDED PARACENTESIS  Comparison:  05/25/2011  An ultrasound guided paracentesis was thoroughly discussed with the patient and questions answered.  The benefits, risks, alternatives and complications were also discussed.  The patient understands and wishes to proceed with the procedure.  Written consent was obtained.  Ultrasound was performed to localize and mark an adequate pocket of fluid in the right lower quadrant of the abdomen.  The area was then prepped and draped in the normal sterile fashion.  1% Lidocaine was used for local anesthesia.  Under ultrasound guidance a 19 gauge Yueh catheter was introduced.  Paracentesis was performed.  The catheter was removed and a dressing applied.  Complications:  None  Findings:  A total of approximately 4.18 liters of clear yellow fluid was removed.  A fluid sample was sent for laboratory analysis.  IMPRESSION: Successful ultrasound guided paracentesis yielding 4.18 liters of ascites.  Original Report Authenticated By: Richarda Overlie, M.D.   EGD (1/21)- negative.  No varices seen  Medications: Scheduled:   . sodium chloride   Intravenous Once  . feeding supplement  237  mL Oral BID  . pantoprazole  40 mg Oral BID AC  . general admission iv infusion   Intravenous Q24H  . sodium chloride  3 mL Intravenous Q12H  . spironolactone  25 mg Oral BID   Continuous:   Assessment/Plan: Principal Problem:  1. *Alcoholic cirrhosis- continue spironolactone for ascites. EGD negative for varices.  I don't think his BP will tolerate BBlocker at this time- will reconsider as BP stabilizes.  Repeat paracentesis due to increased ascites.  No evidence of  encephalopathy.  Active Problems:  2. Ascites- continue spironolactone- BP and hyponatremia limit dose.  Will repeat paracentesis today and give albumen prior to procedure to help prevent hypotension, reaccumulation. 3. Heme positive stool- EGD negative. Continue PPI.  4. Alcoholism- no evidence of withdrawal. Continue Rally pack daily.  5. Anxiety disorder- continue Xanax.  6. Severe protein-calorie malnutrition- encouraged po intake. Ensure supplements.  Encouraged po intake 7. Disposition- anticipate discharge tomorrow if stable after paracentesis.  PT/OT evaluation for home needs.     LOS: 3 days   Bitania Shankland,[name] 05/27/2011, 6:31 AM

## 2011-05-27 NOTE — Progress Notes (Signed)
Physical Therapy Evaluation Patient Details Name: [name] MRN: [id number] DOB: 01-Dec-1945 Today's Date: 05/27/2011  Problem List:  Patient Active Problem List  Diagnoses  . Alcoholic cirrhosis  . Ascites  . Heme positive stool  . Alcoholism  . Anxiety disorder  . Severe protein-calorie malnutrition    Past Medical History:  Past Medical History  Diagnosis Date  . Elevated liver function tests   . Anxiety disorder   . Alcoholism   . White coat hypertension   . Impaired fasting glucose    Past Surgical History:  Past Surgical History  Procedure Date  . Hernia repair   . Colonoscopy 11/2009    2011 - 3 adenomas, diverticulosis, hemorhoids  . Esophagogastroduodenoscopy 05/26/2011    Procedure: ESOPHAGOGASTRODUODENOSCOPY (EGD);  Surgeon: [name], MD;  Location: [facility] ENDOSCOPY;  Service: Endoscopy;  Laterality: N/A;    PT Assessment/Plan/Recommendation PT Assessment Clinical Impression Statement: Pt presents with generalized weakness, decreased strength and ROM, and balance impairments, and will benefit from acute PT to address these impairments and return home with decreased caregiver burden and falls risk. Pt also demonstrates decreased safety awareness and got up without calling moments after instructed on use of call bell.  PT Recommendation/Assessment: Patient will need skilled PT in the acute care venue PT Problem List: Decreased strength;Decreased range of motion;Decreased activity tolerance;Decreased balance;Decreased mobility;Decreased knowledge of use of DME;Decreased safety awareness;Decreased knowledge of precautions Barriers to Discharge: None PT Therapy Diagnosis : Generalized weakness PT Plan PT Frequency: Min 3X/week PT Recommendation Follow Up Recommendations: Home health PT;Supervision/Assistance - 24 hour (Pt states 2 daughters can help 24hr/day) Equipment Recommended: Rolling walker with 5" wheels PT Goals  Acute Rehab PT Goals PT Goal Formulation:  With patient Time For Goal Achievement: 7 days Pt will go Sit to Stand: with modified independence PT Goal: Sit to Stand - Progress: Progressing toward goal Pt will go Stand to Sit: with modified independence PT Goal: Stand to Sit - Progress: Progressing toward goal Pt will Ambulate: >150 feet;with modified independence;with least restrictive assistive device PT Goal: Ambulate - Progress: Progressing toward goal Pt will Go Up / Down Stairs: 3-5 stairs;with supervision;with rail(s) PT Goal: Up/Down Stairs - Progress: Goal set today Pt will Perform Home Exercise Program: with supervision, verbal cues required/provided PT Goal: Perform Home Exercise Program - Progress: Progressing toward goal  PT Evaluation Precautions/Restrictions  Precautions Precautions: Fall Restrictions Weight Bearing Restrictions: No Prior Functioning  Home Living Lives With: Alone Receives Help From: Family (2 daughters help and can provide 24hr care) Type of Home: House Home Layout: Two level;Able to live on main level with bedroom/bathroom Alternate Level Stairs-Rails: Right Alternate Level Stairs-Number of Steps: 13 Home Access: Stairs to enter Entrance Stairs-Rails: Can reach both Entrance Stairs-Number of Steps: 5 Bathroom Shower/Tub: Health visitor: Standard (Plans to get elevated) Bathroom Accessibility: Yes How Accessible: Accessible via walker Home Adaptive Equipment: Other (comment) (Toilet riser chair) Prior Function Level of Independence: Independent with basic ADLs;Independent with gait;Independent with transfers Able to Take Stairs?: Yes Driving: Yes Vocation: Retired Financial risk analyst Arousal/Alertness: Awake/alert Overall Cognitive Status: Appears within functional limits for tasks assessed Orientation Level: Oriented X4 Sensation/Coordination Sensation Light Touch: Appears Intact Coordination Gross Motor Movements are Fluid and Coordinated: Yes Fine Motor  Movements are Fluid and Coordinated: No (Tremors in bil UE) Extremity Assessment RUE Assessment RUE Assessment: Not tested LUE Assessment LUE Assessment: Not tested RLE Assessment RLE Assessment: Exceptions to [facility] RLE Strength RLE Overall Strength Comments: Grossly 3/5 throughout with decreased  knee ext and ankle DF, likely HS tightness LLE Assessment LLE Assessment: Exceptions to [facility] LLE Strength LLE Overall Strength Comments: Grossly 3/5 throughout with decreased knee ext and ankle DF, likely HS tightness Mobility (including Balance) Bed Mobility Bed Mobility: Yes Rolling Left: 5: Supervision;With rail Rolling Left Details (indicate cue type and reason): Cues for hand placement and efficiency Left Sidelying to Sit: 5: Supervision;HOB flat;With rails Left Sidelying to Sit Details (indicate cue type and reason): Cues for timing Transfers Transfers: Yes Sit to Stand: 5: Supervision;With upper extremity assist;From bed;From chair/3-in-1;From toilet Sit to Stand Details (indicate cue type and reason): Cues for safe hand placement Stand to Sit: 5: Supervision;With upper extremity assist;With armrests;To chair/3-in-1;To toilet Stand to Sit Details: Cues to control descent Ambulation/Gait Ambulation/Gait: Yes Ambulation/Gait Assistance: 4: Min assist Ambulation/Gait Assistance Details (indicate cue type and reason): Cues for RW management and proximity to self as pt pushes walker ahead. Pt walks with guarded gate with decreased righting reactions Ambulation Distance (Feet): 100 Feet Assistive device: Rolling walker Gait Pattern: Step-through pattern;Decreased dorsiflexion - right;Decreased dorsiflexion - left;Decreased hip/knee flexion - right;Decreased hip/knee flexion - left;Right flexed knee in stance;Left flexed knee in stance;Shuffle;Decreased trunk rotation;Trunk flexed Stairs: No Wheelchair Mobility Wheelchair Mobility: No  Posture/Postural Control Posture/Postural Control:  Postural limitations (Forward flexed ) Balance Balance Assessed: Yes Dynamic Sitting Balance Dynamic Sitting - Balance Support: Right upper extremity supported;Left upper extremity supported;Feet supported Dynamic Sitting - Level of Assistance: 5: Stand by assistance Dynamic Sitting - Balance Activities: Lateral lean/weight shifting;Forward lean/weight shifting;Reaching for objects;Reaching for weighted objects;Reaching across midline Exercise  General Exercises - Lower Extremity Ankle Circles/Pumps: Seated;20 reps;Both;Strengthening;AROM (Decreased ROM) Long Arc Quad: Seated;20 reps;Both;Strengthening;AROM (Decreased ROM) Hip Flexion/Marching: Seated;Both;Strengthening;AROM (20 reps) End of Session PT - End of Session Equipment Utilized During Treatment: Gait belt Activity Tolerance: Patient tolerated treatment well Patient left: in chair;with call bell in reach Nurse Communication: Mobility status for transfers;Mobility status for ambulation General Behavior During Session: [facility] for tasks performed Cognition: [facility] for tasks performed  [facility] [place], [place] [phone]  05/27/2011, 9:43 AM

## 2011-05-27 NOTE — Procedures (Signed)
US guided paracentesis: RLQ  4.2Liters yellow fluid obtained 35cc sent to lab  Pt tolerated well Post BP: 103/42

## 2011-05-28 DIAGNOSIS — D696 Thrombocytopenia, unspecified: Secondary | ICD-10-CM | POA: Diagnosis present

## 2011-05-28 DIAGNOSIS — E46 Unspecified protein-calorie malnutrition: Secondary | ICD-10-CM | POA: Diagnosis not present

## 2011-05-28 DIAGNOSIS — E871 Hypo-osmolality and hyponatremia: Secondary | ICD-10-CM | POA: Diagnosis present

## 2011-05-28 DIAGNOSIS — R188 Other ascites: Secondary | ICD-10-CM | POA: Diagnosis not present

## 2011-05-28 LAB — CBC
HCT: 38.6 % — ABNORMAL LOW (ref 39.0–52.0)
Hemoglobin: 13.5 g/dL (ref 13.0–17.0)
MCHC: 35 g/dL (ref 30.0–36.0)
MCV: 95.1 fL (ref 78.0–100.0)
RDW: 15.5 % (ref 11.5–15.5)

## 2011-05-28 LAB — COMPREHENSIVE METABOLIC PANEL
ALT: 33 U/L (ref 0–53)
Albumin: 2.3 g/dL — ABNORMAL LOW (ref 3.5–5.2)
Alkaline Phosphatase: 176 U/L — ABNORMAL HIGH (ref 39–117)
Calcium: 8.2 mg/dL — ABNORMAL LOW (ref 8.4–10.5)
Potassium: 4.4 mEq/L (ref 3.5–5.1)
Sodium: 133 mEq/L — ABNORMAL LOW (ref 135–145)
Total Protein: 6.6 g/dL (ref 6.0–8.3)

## 2011-05-28 MED ORDER — SPIRONOLACTONE 25 MG PO TABS: 25.0000 mg | ORAL_TABLET | Freq: Two times a day (BID) | ORAL | Status: DC

## 2011-05-28 MED ORDER — PANTOPRAZOLE SODIUM 40 MG PO TBEC: 40.0000 mg | DELAYED_RELEASE_TABLET | Freq: Two times a day (BID) | ORAL | Status: DC

## 2011-05-28 MED ORDER — ENSURE CLINICAL ST REVIGOR PO LIQD: 237.0000 mL | Freq: Two times a day (BID) | ORAL | Status: DC

## 2011-05-28 MED ORDER — WHITE PETROLATUM GEL
Status: AC
  Administered 2011-05-28
  Filled 2011-05-28: qty 5

## 2011-05-28 NOTE — Discharge Summary (Signed)
DISCHARGE SUMMARY  [name]  MR#: [id number]  DOB:1945/05/07  Date of Admission: 05/24/2011 Date of Discharge: 05/28/2011  Attending Physician:[name],W [name]  Patient's ION:[name],Lance Riley Lam, MD, MD  Consults: [facility] (Dr. Preston White)  Discharge Diagnoses: Principal Problem:  *Alcoholic cirrhosis Active Problems:  Ascites  Heme positive stool  Alcoholism  Anxiety disorder  Severe protein-calorie malnutrition  Hyponatremia  Thrombocytopenia  Past Medical History:  Anxiety Disorder  Elevated LFTs due to ETOH  Elevated PSA, transient  Impaired Fasting Glucose  "White Coat" HTN  Discharge Medications: Medication List  As of 05/28/2011  7:55 AM   TAKE these medications         ALPRAZolam 0.5 MG tablet   Commonly known as: XANAX   Take 0.5 mg by mouth 3 (three) times daily as needed. For anxiety      bismuth subsalicylate 262 MG/15ML suspension   Commonly known as: PEPTO BISMOL   Take 15 mLs by mouth every 6 (six) hours as needed. For upset stomach      feeding supplement Liqd   Take 237 mLs by mouth 2 (two) times daily.      pantoprazole 40 MG tablet   Commonly known as: PROTONIX   Take 1 tablet (40 mg total) by mouth 2 (two) times daily before a meal.      spironolactone 25 MG tablet   Commonly known as: ALDACTONE   Take 1 tablet (25 mg total) by mouth 2 (two) times daily.            Hospital Procedures: US Abdomen Complete 05/25/2011  *RADIOLOGY REPORT*  Clinical Data:  Abdominal distention.  History of fatty liver. Evaluate liver.  COMPLETE ABDOMINAL ULTRASOUND  Comparison:  02/04/2011  Findings:  Gallbladder:  Multiple echogenic foci within the gallbladder compatible with small stones.  Bladder wall is thickened at 6 mm. Negative sonographic Murphy's.  Common bile duct:   Normal, 4 mm.  Liver:  Increased echotexture throughout the liver.  Nodular contours to the liver surface.  Findings compatible with cirrhosis. Associated moderate ascites.  No  biliary ductal dilatation.  IVC:  Not well visualized due to overlying bowel gas.  Pancreas:  Not well visualized due to overlying bowel gas.  Spleen:  Normal size, 8 cm in craniocaudal length.  Right Kidney:   Normal in size and parenchymal echogenicity.  No evidence of mass or hydronephrosis.  Left Kidney:  Normal in size and parenchymal echogenicity.  No evidence of mass or hydronephrosis.  Abdominal aorta:  Not visualized due to overlying bowel gas.  IMPRESSION: Changes of cirrhosis within the liver.  Associated moderate ascites.  Cholelithiasis.  Gallbladder wall thickening may be related to liver disease, chronic cholecystitis, or a combination.  No changes of acute cholecystitis.  Midline structures difficult to visualize due to overlying bowel gas.  Original Report Authenticated By: [name], Lance.D.   US Paracentesis 05/27/2011  *RADIOLOGY REPORT*  Clinical Data: Abdominal ascites  ULTRASOUND GUIDED PARACENTESIS  Comparison:  Previous paracentesis  An ultrasound guided paracentesis was thoroughly discussed with the patient and questions answered.  The benefits, risks, alternatives and complications were also discussed.  The patient understands and wishes to proceed with the procedure.  Written consent was obtained.  Ultrasound was performed to localize and mark an adequate pocket of fluid in the right lower quadrant of the abdomen.  The area was then prepped and draped in the normal sterile fashion.  1% Lidocaine was used for local anesthesia.  Under ultrasound guidance a 19  gauge Yueh catheter was introduced.  Paracentesis was performed.  The catheter was removed and a dressing applied.  Complications:  None  Findings:  A total of approximately 4.2 liters of yellow fluid was removed.  A fluid sample was sent for laboratory analysis.  IMPRESSION: Successful ultrasound guided paracentesis yielding 4.2 liters of ascites. Post blood pressure reading was 103/42.  Read by: [name], P.A.-C  Original  Report Authenticated By: Lance Petit. [name], Lance.D.   US Paracentesis 05/25/2011  *RADIOLOGY REPORT*  Clinical Data: Cirrhosis and ascites.  ULTRASOUND GUIDED PARACENTESIS  Comparison:  05/25/2011  An ultrasound guided paracentesis was thoroughly discussed with the patient and questions answered.  The benefits, risks, alternatives and complications were also discussed.  The patient understands and wishes to proceed with the procedure.  Written consent was obtained.  Ultrasound was performed to localize and mark an adequate pocket of fluid in the right lower quadrant of the abdomen.  The area was then prepped and draped in the normal sterile fashion.  1% Lidocaine was used for local anesthesia.  Under ultrasound guidance a 19 gauge Yueh catheter was introduced.  Paracentesis was performed.  The catheter was removed and a dressing applied.  Complications:  None  Findings:  A total of approximately 4.18 liters of clear yellow fluid was removed.  A fluid sample was sent for laboratory analysis.  IMPRESSION: Successful ultrasound guided paracentesis yielding 4.18 liters of ascites.  Original Report Authenticated By: [name], Lance.D.   EGD (1/21):  1) Essentially Normal EGD. No varices  2) Mild gastritis in the antrum    History of Present Illness: Mr. [name] is a 66 year old white male with a history of alcoholism, anxiety disorder, and elevated liver function test who presented to the emergency department today with the complaint of abdominal pain and weakness. The patient reports severe diarrhea over the past 4-6 months which has been debilitating. He has noticed an increase in generalized weakness and unsteady gait associated with this. About 3 weeks ago he noticed a significant increase in abdominal girth associated with mild abdominal discomfort. Despite the increase in abdominal girth he has lost about 20-30 pounds. He called his daughter tonight stating that he felt like he needed to go to the hospital due to  the severity of his weakness and increasing abdominal symptoms. In addition, he reports dark tarry stools off and on for the past several months. He denies any hematemesis, nausea/vomiting, fever/chills. He states that he has been tapering his alcohol use and endorses 2-3 glasses of wine per day. Of note, his liver function tests were elevated in 9/12 (alkaline phosphatase around 300, AST around 170, and ALT around 60). He underwent an abdominal ultrasound at that time that showed fatty liver and gallstones which were uncomplicated. He was advised to discontinue alcohol use and return for repeat liver function test in one month. Unfortunately, he has not been seen since that time and has continued to drink alcohol.   Hospital Course: Mr. Gutridge was admitted to a medical bed.  An ultrasound was performed showing findings consistent with cirrhosis with moderate ascites. He was started on spironolactone 25 mg twice a day. Dose was limited by borderline hypotension and hyponatremia. He underwent ultrasound-guided paracentesis on 1/20 yielding 4 L of transudative fluid with no evidence of SBP. Cytology was negative. He continued to have moderate ascites after this procedure and underwent a second ultrasound guided paracentesis on 1/22, again yielding at least 4 L. With this, the patient reported  significant improvement in his abdominal distention, discomfort, and overall sense of well-being.  His cirrhosis was evaluated further with laboratory testing which included negative hepatitis A., B., and C testing. Iron profile for hemachromatosis and ANA for autoimmune hepatitis were also normal. He does have a history of heavy alcohol use in his cirrhosis felt secondary to alcohol abuse.    He was noted to have heme positive stools on admission. GI was consulted for an EGD on 1/21 to rule out varices. This was normal except for mild gastritis with no evidence of varices or gastric ulcer. He was placed on a PPI twice a  day which will be continued for now.    With improvement in his ascites the patient reported significant improvement overall. He was evaluated by physical therapy who recommended home health physical therapy and 24 hour assistance from family.  I spoke with his daughter she states that they will be available to assist him as needed and are comfortable with this discharge home. He was instructed to abstain from all alcohol.    Day of Discharge Exam BP 89/54  Pulse 93  Temp(Src) 98.4 F (36.9 C) (Oral)  Resp 20  Ht 6' (1.829 Lance)  Wt 67 kg (147 lb 11.3 oz)  BMI 20.03 kg/m2  SpO2 95%  Physical Exam: General appearance: chronically ill Eyes: no scleral icterus Throat: oropharynx moist without erythema Resp: clear to auscultation bilaterally Cardio: regular rate and rhythm, S1, S2 normal, no murmur, click, rub or gallop GI: soft, non-tender; bowel sounds normal; no masses, mild ascites Extremities: no clubbing, cyanosis, trace dema  Discharge Labs:  [facility] 05/28/11 0540 05/27/11 0620  NA 133* 131*  K 4.4 3.9  CL 99 99  CO2 25 25  GLUCOSE 100* 108*  BUN 15 16  CREATININE 0.46* 0.40*  CALCIUM 8.2* 7.9*  MG -- --  PHOS -- --    Basename 05/28/11 0540 05/27/11 0620  AST 69* 59*  ALT 33 28  ALKPHOS 176* 149*  BILITOT 1.4* 1.2  PROT 6.6 5.8*  ALBUMIN 2.3* 2.1*    Basename 05/28/11 0540 05/27/11 0620  WBC 9.9 9.8  NEUTROABS -- --  HGB 13.5 12.3*  HCT 38.6* 35.3*  MCV 95.1 94.9  PLT 150 137*   Lab Results  Component Value Date   INR 1.29 05/25/2011   TSH 2.563 Ferritin 857 ANA negative  Hepatitis A antibody negative  Hepatitis B surface antibody/surface antigen negative  Hepatitis C antibody negative  Alpha Fetoprotein 3.5 (normal) Ammonia 67  Ascites fluid analysis (transudative):  Albumen 0.8 (SAAG 1.3)  Protein 1.6  WBC 134 (74 lymphs, 15 PMNs, 11 monos)  Cytology negative    Discharge instructions: Discharge Orders    Future Orders Please Complete  By Expires   Diet general - low sodium (2500 mg)     Increase activity slowly      Discharge instructions      Comments:   Weigh yourself daily and call if weight increases more than 5 pounds from baseline. Call if increased abdominal pain, fever above 101.5, significant increase in abdominal swelling.  Do not drink ANY alcohol.  Fall precautions to prevent falls (use walker when ambulating, exercise caution, and avoid sedating medications).      Disposition: To home  Follow-up Appts: Follow-up with Dr. Clelia Croft at [facility] in 1 week.  Call for appointment.  Condition on Discharge: Improved  Tests Needing Follow-up: Repeat CMET  Time with discharge extremities: 40 minutes Signed: Yordan Martindale,W [name]  05/28/2011, 7:55 AM

## 2011-05-28 NOTE — Progress Notes (Signed)
   CARE MANAGEMENT NOTE 05/28/2011  Patient:  SUTTER, AHLGREN   Account Number:  [id number]  Date Initiated:  05/28/2011  Documentation initiated by:  Hyden Soley  Subjective/Objective Assessment:   Received orders for [facility] and rolling walk as well as referral to [facility]     Action/Plan:   Referral made to [facility], met with pt and daughter, re [facility] and DME. Pt declines HHRN however agrees to HHPT/OT. Rolling walker ordered.   Anticipated DC Date:  05/28/2011   Anticipated DC Plan:  HOME W HOME HEALTH SERVICES         [facility] Choice  DURABLE MEDICAL EQUIPMENT  HOME HEALTH   Choice offered to / List presented to:  C-1 Patient   DME arranged  [facility] Hurst      DME agency  [facility]     HH arranged  HH-2 PT  HH-3 OT      [facility] agency  [facility]   Status of service:  Completed, signed off Medicare Important Message given?   (If response is "NO", the following Medicare IM given date fields will be blank) Date Medicare IM given:   Date Additional Medicare IM given:    Discharge Disposition:  HOME W HOME HEALTH SERVICES  Per UR Regulation:    Comments:

## 2011-05-28 NOTE — Progress Notes (Signed)
Discussed discharge instructions with pt and daughter. Pt showed no barriers to learning. Removed IV. Pt discharged to home with walker and home health.

## 2011-05-29 NOTE — Progress Notes (Signed)
Patient referred for chronic disease management consult by cheryl royal RN CM.  [facility] is not currently in network with the patient's secondary insurance ([employer]) and therefore unable to accept the referral.  Thank you for your time and consideration of our services.  If you should have any further questions please do not hesitate to contact me at [phone].

## 2011-06-02 DIAGNOSIS — K7030 Alcoholic cirrhosis of liver without ascites: Secondary | ICD-10-CM | POA: Diagnosis not present

## 2011-06-02 DIAGNOSIS — R627 Adult failure to thrive: Secondary | ICD-10-CM | POA: Diagnosis not present

## 2011-06-02 DIAGNOSIS — D696 Thrombocytopenia, unspecified: Secondary | ICD-10-CM | POA: Diagnosis not present

## 2011-06-02 DIAGNOSIS — F411 Generalized anxiety disorder: Secondary | ICD-10-CM | POA: Diagnosis not present

## 2011-06-02 DIAGNOSIS — R188 Other ascites: Secondary | ICD-10-CM | POA: Diagnosis not present

## 2011-06-09 DIAGNOSIS — R7301 Impaired fasting glucose: Secondary | ICD-10-CM | POA: Diagnosis not present

## 2011-06-09 DIAGNOSIS — K7030 Alcoholic cirrhosis of liver without ascites: Secondary | ICD-10-CM | POA: Diagnosis not present

## 2011-06-09 DIAGNOSIS — E43 Unspecified severe protein-calorie malnutrition: Secondary | ICD-10-CM | POA: Diagnosis not present

## 2011-06-09 DIAGNOSIS — R188 Other ascites: Secondary | ICD-10-CM | POA: Diagnosis not present

## 2011-06-18 ENCOUNTER — Other Ambulatory Visit (HOSPITAL_COMMUNITY): Payer: Medicare Other

## 2011-06-18 ENCOUNTER — Other Ambulatory Visit (HOSPITAL_COMMUNITY): Payer: Self-pay | Admitting: Internal Medicine

## 2011-06-18 ENCOUNTER — Ambulatory Visit (HOSPITAL_COMMUNITY)
Admission: RE | Admit: 2011-06-18 | Discharge: 2011-06-18 | Disposition: A | Discharge status: Home / Self Care | Payer: Medicare Other | Source: Ambulatory Visit | Attending: Internal Medicine | Admitting: Internal Medicine

## 2011-06-18 DIAGNOSIS — F1020 Alcohol dependence, uncomplicated: Secondary | ICD-10-CM | POA: Insufficient documentation

## 2011-06-18 DIAGNOSIS — R188 Other ascites: Secondary | ICD-10-CM | POA: Insufficient documentation

## 2011-06-18 DIAGNOSIS — K7030 Alcoholic cirrhosis of liver without ascites: Secondary | ICD-10-CM

## 2011-06-18 LAB — BODY FLUID CELL COUNT WITH DIFFERENTIAL
Lymphs, Fluid: 49 %
Neutrophil Count, Fluid: 20 % (ref 0–25)
Total Nucleated Cell Count, Fluid: 152 cu mm (ref 0–1000)

## 2011-06-18 LAB — PATHOLOGIST SMEAR REVIEW

## 2011-06-26 DIAGNOSIS — R188 Other ascites: Secondary | ICD-10-CM | POA: Diagnosis not present

## 2011-06-26 DIAGNOSIS — K7030 Alcoholic cirrhosis of liver without ascites: Secondary | ICD-10-CM | POA: Diagnosis not present

## 2011-06-26 DIAGNOSIS — R7301 Impaired fasting glucose: Secondary | ICD-10-CM | POA: Diagnosis not present

## 2011-06-26 DIAGNOSIS — E43 Unspecified severe protein-calorie malnutrition: Secondary | ICD-10-CM | POA: Diagnosis not present

## 2011-06-30 DIAGNOSIS — R188 Other ascites: Secondary | ICD-10-CM | POA: Diagnosis not present

## 2011-06-30 DIAGNOSIS — K7030 Alcoholic cirrhosis of liver without ascites: Secondary | ICD-10-CM | POA: Diagnosis not present

## 2011-06-30 DIAGNOSIS — E43 Unspecified severe protein-calorie malnutrition: Secondary | ICD-10-CM | POA: Diagnosis not present

## 2011-06-30 DIAGNOSIS — R7301 Impaired fasting glucose: Secondary | ICD-10-CM | POA: Diagnosis not present

## 2011-07-04 ENCOUNTER — Ambulatory Visit (HOSPITAL_COMMUNITY)
Admission: RE | Admit: 2011-07-04 | Discharge: 2011-07-04 | Disposition: A | Discharge status: Home / Self Care | Payer: Medicare Other | Source: Ambulatory Visit | Attending: Internal Medicine | Admitting: Internal Medicine

## 2011-07-04 ENCOUNTER — Other Ambulatory Visit (HOSPITAL_COMMUNITY): Payer: Self-pay | Admitting: Internal Medicine

## 2011-07-04 DIAGNOSIS — R188 Other ascites: Secondary | ICD-10-CM

## 2011-07-04 DIAGNOSIS — E43 Unspecified severe protein-calorie malnutrition: Secondary | ICD-10-CM

## 2011-07-04 DIAGNOSIS — D696 Thrombocytopenia, unspecified: Secondary | ICD-10-CM

## 2011-07-04 DIAGNOSIS — F419 Anxiety disorder, unspecified: Secondary | ICD-10-CM

## 2011-07-04 DIAGNOSIS — K7030 Alcoholic cirrhosis of liver without ascites: Secondary | ICD-10-CM

## 2011-07-04 DIAGNOSIS — F1020 Alcohol dependence, uncomplicated: Secondary | ICD-10-CM

## 2011-07-04 DIAGNOSIS — E871 Hypo-osmolality and hyponatremia: Secondary | ICD-10-CM

## 2011-07-04 DIAGNOSIS — R195 Other fecal abnormalities: Secondary | ICD-10-CM

## 2011-07-04 NOTE — Procedures (Signed)
US guided paracentesis RLQ; 6.5 Liters yellow fluid  Pt tolerated well BP stable

## 2011-07-07 DIAGNOSIS — R188 Other ascites: Secondary | ICD-10-CM | POA: Diagnosis not present

## 2011-07-07 DIAGNOSIS — K7030 Alcoholic cirrhosis of liver without ascites: Secondary | ICD-10-CM | POA: Diagnosis not present

## 2011-07-07 DIAGNOSIS — E43 Unspecified severe protein-calorie malnutrition: Secondary | ICD-10-CM | POA: Diagnosis not present

## 2011-07-14 ENCOUNTER — Other Ambulatory Visit (HOSPITAL_COMMUNITY): Payer: Self-pay | Admitting: Internal Medicine

## 2011-07-15 ENCOUNTER — Ambulatory Visit (HOSPITAL_COMMUNITY)
Admission: RE | Admit: 2011-07-15 | Discharge: 2011-07-15 | Disposition: A | Discharge status: Home / Self Care | Payer: Medicare Other | Source: Ambulatory Visit | Attending: Internal Medicine | Admitting: Internal Medicine

## 2011-07-15 DIAGNOSIS — R188 Other ascites: Secondary | ICD-10-CM | POA: Diagnosis not present

## 2011-07-15 NOTE — Procedures (Signed)
Procedure : ultrasound paracentesis Specimen : 4.4 L cloudy serous fluid Complications : none immediate

## 2011-08-12 ENCOUNTER — Other Ambulatory Visit (HOSPITAL_COMMUNITY): Payer: Self-pay | Admitting: Internal Medicine

## 2011-08-13 ENCOUNTER — Ambulatory Visit (HOSPITAL_COMMUNITY)
Admission: RE | Admit: 2011-08-13 | Discharge: 2011-08-13 | Disposition: A | Discharge status: Home / Self Care | Payer: Medicare Other | Source: Ambulatory Visit | Attending: Internal Medicine | Admitting: Internal Medicine

## 2011-08-13 DIAGNOSIS — K7460 Unspecified cirrhosis of liver: Secondary | ICD-10-CM | POA: Diagnosis not present

## 2011-08-13 DIAGNOSIS — R188 Other ascites: Secondary | ICD-10-CM | POA: Insufficient documentation

## 2011-08-13 NOTE — Procedures (Signed)
US guided therapeutic paracentesis performed yielding 4.3 liters yellow fluid. No immediate complications. 

## 2011-08-19 ENCOUNTER — Encounter: Payer: Self-pay | Admitting: Gastroenterology

## 2011-08-19 DIAGNOSIS — R197 Diarrhea, unspecified: Secondary | ICD-10-CM | POA: Diagnosis not present

## 2011-08-19 DIAGNOSIS — E43 Unspecified severe protein-calorie malnutrition: Secondary | ICD-10-CM | POA: Diagnosis not present

## 2011-08-19 DIAGNOSIS — K7030 Alcoholic cirrhosis of liver without ascites: Secondary | ICD-10-CM | POA: Diagnosis not present

## 2011-08-19 DIAGNOSIS — R188 Other ascites: Secondary | ICD-10-CM | POA: Diagnosis not present

## 2011-09-15 ENCOUNTER — Ambulatory Visit (INDEPENDENT_AMBULATORY_CARE_PROVIDER_SITE_OTHER): Payer: Medicare Other | Admitting: Gastroenterology

## 2011-09-15 ENCOUNTER — Encounter: Payer: Self-pay | Admitting: Gastroenterology

## 2011-09-15 VITALS — BP 110/60 | HR 80 | Ht 72.0 in | Wt 140.0 lb

## 2011-09-15 DIAGNOSIS — K7030 Alcoholic cirrhosis of liver without ascites: Secondary | ICD-10-CM

## 2011-09-15 DIAGNOSIS — Z23 Encounter for immunization: Secondary | ICD-10-CM | POA: Diagnosis not present

## 2011-09-15 DIAGNOSIS — R188 Other ascites: Secondary | ICD-10-CM

## 2011-09-15 MED ORDER — SPIRONOLACTONE 100 MG PO TABS: 100.0000 mg | ORAL_TABLET | Freq: Every day | ORAL | Status: DC

## 2011-09-15 MED ORDER — FUROSEMIDE 40 MG PO TABS: 40.0000 mg | ORAL_TABLET | Freq: Every day | ORAL | Status: DC

## 2011-09-15 NOTE — Patient Instructions (Addendum)
We have sent the following medications to your pharmacy for you to pick up at your convenience: Lasix 40 mg one tablet by mouth every morning. Also take your spironolactone 100 mg tablet by mouth every morning.  We gave you your first Twinrix injection today. You will come back on 09/22/11 at 9:00am to get your 2nd injection. Then you will come back on 10/10/11 at 9:00am for your third injection. We will contact you in a year to schedule your last booster injection.  cc: Buren Kos, MD

## 2011-09-15 NOTE — Progress Notes (Signed)
History of Present Illness: This is a 66 year old male here today with his daughter. He was hospitalized in January 2013 and underwent evaluation for Hemoccult-positive stool with an upper endoscopy that was negative. I reviewed the hospital records. Colonoscopy was performed in July 2011 showing diverticulosis 2 small adenomatous colon polyps and internal hemorrhoids. Hepatitis A and hepatitis B markers were negative in January. He has alcoholic cirrhosis and has had recurrent problems with ascites requiring paracentesis on multiple occasions over the past several months. His last paracentesis was on April 10 with 4.3 L removed. His spironolactone was increased from 25 mg a day to 50 mg a day and most recently to 100 mg a day. It has now been 5 weeks since his last paracentesis which is the longest interval he has gone without paracentesis in the past few months. He notes that he has to get up at least 3 times each night to urinate. A complete metabolic panel done on May 8 was remarkable only for a mildly elevated glucose of 132 and a slightly low sodium at 133.  Current Medications, Allergies, Past Medical History, Past Surgical History, Family History and Social History were reviewed in [employer] record.  Physical Exam: General: Well developed , well nourished, no acute distress Head: Normocephalic and atraumatic Eyes:  sclerae anicteric, EOMI Ears: Normal auditory acuity Mouth: No deformity or lesions Lungs: Clear throughout to auscultation Heart: Regular rate and rhythm; no murmurs, rubs or bruits Abdomen: Soft, non tender and mildly distended with shifting dullness and a fluid wave. No masses, hepatosplenomegaly or hernias noted. Normal Bowel sounds Musculoskeletal: Symmetrical with no gross deformities  Pulses:  Normal pulses noted Extremities: No clubbing, cyanosis, edema or deformities noted Neurological: Alert oriented x 4, grossly nonfocal Psychological:  Alert  and cooperative. Normal mood and affect  Assessment and Recommendations:  1. Alcoholic cirrhosis. Maintain abstinence from alcohol. Begin hepatitis A and hepatitis B vaccinations.   2. Ascites secondary to 1. Add furosemide 40 mg daily and change spironolcatone to 100 mg once daily to help decrease nighttime urination. He is to monitor his weights on a daily basis and observe for any dizziness or signs of dehydration.

## 2011-09-22 ENCOUNTER — Ambulatory Visit (INDEPENDENT_AMBULATORY_CARE_PROVIDER_SITE_OTHER): Payer: Medicare Other | Admitting: Gastroenterology

## 2011-09-22 DIAGNOSIS — Z23 Encounter for immunization: Secondary | ICD-10-CM

## 2011-09-22 DIAGNOSIS — K7030 Alcoholic cirrhosis of liver without ascites: Secondary | ICD-10-CM

## 2011-10-10 ENCOUNTER — Ambulatory Visit (INDEPENDENT_AMBULATORY_CARE_PROVIDER_SITE_OTHER): Payer: Medicare Other | Admitting: Gastroenterology

## 2011-10-10 DIAGNOSIS — K7030 Alcoholic cirrhosis of liver without ascites: Secondary | ICD-10-CM | POA: Diagnosis not present

## 2011-10-10 DIAGNOSIS — Z23 Encounter for immunization: Secondary | ICD-10-CM

## 2011-11-05 ENCOUNTER — Other Ambulatory Visit: Payer: Self-pay | Admitting: Gastroenterology

## 2011-11-19 DIAGNOSIS — E43 Unspecified severe protein-calorie malnutrition: Secondary | ICD-10-CM | POA: Diagnosis not present

## 2011-11-19 DIAGNOSIS — K7030 Alcoholic cirrhosis of liver without ascites: Secondary | ICD-10-CM | POA: Diagnosis not present

## 2011-11-19 DIAGNOSIS — R188 Other ascites: Secondary | ICD-10-CM | POA: Diagnosis not present

## 2011-11-19 DIAGNOSIS — R030 Elevated blood-pressure reading, without diagnosis of hypertension: Secondary | ICD-10-CM | POA: Diagnosis not present

## 2011-11-19 DIAGNOSIS — R7301 Impaired fasting glucose: Secondary | ICD-10-CM | POA: Diagnosis not present

## 2012-01-13 DIAGNOSIS — Z23 Encounter for immunization: Secondary | ICD-10-CM | POA: Diagnosis not present

## 2012-05-25 DIAGNOSIS — R7301 Impaired fasting glucose: Secondary | ICD-10-CM | POA: Diagnosis not present

## 2012-05-25 DIAGNOSIS — K7030 Alcoholic cirrhosis of liver without ascites: Secondary | ICD-10-CM | POA: Diagnosis not present

## 2012-05-25 DIAGNOSIS — R188 Other ascites: Secondary | ICD-10-CM | POA: Diagnosis not present

## 2012-05-25 DIAGNOSIS — E43 Unspecified severe protein-calorie malnutrition: Secondary | ICD-10-CM | POA: Diagnosis not present

## 2012-10-19 ENCOUNTER — Ambulatory Visit: Payer: Medicare Other | Admitting: Gastroenterology

## 2012-10-19 DIAGNOSIS — Z23 Encounter for immunization: Secondary | ICD-10-CM

## 2013-02-03 DIAGNOSIS — Z0000 Encounter for general adult medical examination without abnormal findings: Secondary | ICD-10-CM | POA: Diagnosis not present

## 2013-02-03 DIAGNOSIS — R7301 Impaired fasting glucose: Secondary | ICD-10-CM | POA: Diagnosis not present

## 2013-02-03 DIAGNOSIS — Z125 Encounter for screening for malignant neoplasm of prostate: Secondary | ICD-10-CM | POA: Diagnosis not present

## 2013-02-03 DIAGNOSIS — K7030 Alcoholic cirrhosis of liver without ascites: Secondary | ICD-10-CM | POA: Diagnosis not present

## 2013-02-15 DIAGNOSIS — F17200 Nicotine dependence, unspecified, uncomplicated: Secondary | ICD-10-CM | POA: Diagnosis not present

## 2013-02-15 DIAGNOSIS — R188 Other ascites: Secondary | ICD-10-CM | POA: Diagnosis not present

## 2013-02-15 DIAGNOSIS — K7030 Alcoholic cirrhosis of liver without ascites: Secondary | ICD-10-CM | POA: Diagnosis not present

## 2013-02-15 DIAGNOSIS — E43 Unspecified severe protein-calorie malnutrition: Secondary | ICD-10-CM | POA: Diagnosis not present

## 2013-02-15 DIAGNOSIS — Z23 Encounter for immunization: Secondary | ICD-10-CM | POA: Diagnosis not present

## 2013-02-15 DIAGNOSIS — Z0000 Encounter for general adult medical examination without abnormal findings: Secondary | ICD-10-CM | POA: Diagnosis not present

## 2013-02-15 DIAGNOSIS — R7301 Impaired fasting glucose: Secondary | ICD-10-CM | POA: Diagnosis not present

## 2013-02-15 DIAGNOSIS — Z125 Encounter for screening for malignant neoplasm of prostate: Secondary | ICD-10-CM | POA: Diagnosis not present

## 2013-02-15 DIAGNOSIS — Z1331 Encounter for screening for depression: Secondary | ICD-10-CM | POA: Diagnosis not present

## 2013-02-15 DIAGNOSIS — R030 Elevated blood-pressure reading, without diagnosis of hypertension: Secondary | ICD-10-CM | POA: Diagnosis not present

## 2013-02-15 DIAGNOSIS — F411 Generalized anxiety disorder: Secondary | ICD-10-CM | POA: Diagnosis not present

## 2013-02-16 ENCOUNTER — Other Ambulatory Visit: Payer: Self-pay | Admitting: Internal Medicine

## 2013-02-16 DIAGNOSIS — K769 Liver disease, unspecified: Secondary | ICD-10-CM

## 2013-02-16 DIAGNOSIS — I714 Abdominal aortic aneurysm, without rupture: Secondary | ICD-10-CM

## 2013-02-16 DIAGNOSIS — Z1212 Encounter for screening for malignant neoplasm of rectum: Secondary | ICD-10-CM | POA: Diagnosis not present

## 2013-02-18 ENCOUNTER — Ambulatory Visit
Admission: RE | Admit: 2013-02-18 | Discharge: 2013-02-18 | Disposition: A | Discharge status: Home / Self Care | Payer: Medicare Other | Source: Ambulatory Visit | Attending: Internal Medicine | Admitting: Internal Medicine

## 2013-02-18 DIAGNOSIS — K8020 Calculus of gallbladder without cholecystitis without obstruction: Secondary | ICD-10-CM | POA: Diagnosis not present

## 2013-02-18 DIAGNOSIS — I714 Abdominal aortic aneurysm, without rupture: Secondary | ICD-10-CM

## 2013-02-18 DIAGNOSIS — K769 Liver disease, unspecified: Secondary | ICD-10-CM

## 2013-03-04 ENCOUNTER — Other Ambulatory Visit: Payer: Self-pay | Admitting: Internal Medicine

## 2013-03-04 DIAGNOSIS — N289 Disorder of kidney and ureter, unspecified: Secondary | ICD-10-CM

## 2013-03-08 ENCOUNTER — Other Ambulatory Visit: Payer: Self-pay | Admitting: Internal Medicine

## 2013-03-08 ENCOUNTER — Ambulatory Visit
Admission: RE | Admit: 2013-03-08 | Discharge: 2013-03-08 | Disposition: A | Discharge status: Home / Self Care | Payer: Medicare Other | Source: Ambulatory Visit | Attending: Internal Medicine | Admitting: Internal Medicine

## 2013-03-08 DIAGNOSIS — N289 Disorder of kidney and ureter, unspecified: Secondary | ICD-10-CM

## 2013-03-08 DIAGNOSIS — N2889 Other specified disorders of kidney and ureter: Secondary | ICD-10-CM | POA: Diagnosis not present

## 2013-03-08 MED ORDER — IOHEXOL 300 MG/ML  SOLN
100.0000 mL | Freq: Once | INTRAMUSCULAR | Status: AC | PRN
  Administered 2013-03-08: 100 mL via INTRAVENOUS

## 2013-03-09 ENCOUNTER — Other Ambulatory Visit: Payer: Self-pay | Admitting: Internal Medicine

## 2013-03-09 DIAGNOSIS — N281 Cyst of kidney, acquired: Secondary | ICD-10-CM

## 2013-03-09 DIAGNOSIS — R911 Solitary pulmonary nodule: Secondary | ICD-10-CM

## 2013-03-10 ENCOUNTER — Ambulatory Visit
Admission: RE | Admit: 2013-03-10 | Discharge: 2013-03-10 | Disposition: A | Discharge status: Home / Self Care | Payer: Medicare Other | Source: Ambulatory Visit | Attending: Internal Medicine | Admitting: Internal Medicine

## 2013-03-10 DIAGNOSIS — N281 Cyst of kidney, acquired: Secondary | ICD-10-CM | POA: Diagnosis not present

## 2013-03-10 MED ORDER — GADOBENATE DIMEGLUMINE 529 MG/ML IV SOLN
13.0000 mL | Freq: Once | INTRAVENOUS | Status: AC | PRN
  Administered 2013-03-10: 13 mL via INTRAVENOUS

## 2013-03-16 ENCOUNTER — Other Ambulatory Visit: Payer: Medicare Other

## 2013-04-08 ENCOUNTER — Ambulatory Visit
Admission: RE | Admit: 2013-04-08 | Discharge: 2013-04-08 | Disposition: A | Discharge status: Home / Self Care | Payer: Medicare Other | Source: Ambulatory Visit | Attending: Internal Medicine | Admitting: Internal Medicine

## 2013-04-08 DIAGNOSIS — R918 Other nonspecific abnormal finding of lung field: Secondary | ICD-10-CM | POA: Diagnosis not present

## 2013-04-08 DIAGNOSIS — R911 Solitary pulmonary nodule: Secondary | ICD-10-CM

## 2013-04-14 ENCOUNTER — Encounter: Payer: Self-pay | Admitting: Gastroenterology

## 2013-04-18 ENCOUNTER — Telehealth: Payer: Self-pay | Admitting: Gastroenterology

## 2013-04-18 NOTE — Telephone Encounter (Signed)
Patient with a history of cirrhosis, not seen in the office since 2013.  He declined to schedule an office visit that was recommended in recall letter.

## 2013-04-18 NOTE — Telephone Encounter (Signed)
Noted that he declines recommended follow up.

## 2013-10-06 ENCOUNTER — Encounter: Payer: Self-pay | Admitting: Gastroenterology

## 2013-10-24 ENCOUNTER — Encounter: Payer: Self-pay | Admitting: Gastroenterology

## 2013-10-24 ENCOUNTER — Ambulatory Visit (INDEPENDENT_AMBULATORY_CARE_PROVIDER_SITE_OTHER): Payer: Medicare Other | Admitting: Gastroenterology

## 2013-10-24 VITALS — BP 100/66 | HR 68 | Ht 72.0 in | Wt 150.8 lb

## 2013-10-24 DIAGNOSIS — Z8679 Personal history of other diseases of the circulatory system: Secondary | ICD-10-CM | POA: Diagnosis not present

## 2013-10-24 DIAGNOSIS — Z86010 Personal history of colonic polyps: Secondary | ICD-10-CM

## 2013-10-24 NOTE — Patient Instructions (Signed)
Stop taking your spironolactone.   We have cancelled your Endoscopy recalls.   You will be due for a recall colonoscopy in 11/2014. We will send you a reminder in the mail when it gets closer to that time.  Thank you for choosing me and [facility].  Pricilla Riffle. Dagoberto Ligas., MD., Marval Regal  cc: Marton Redwood, MD

## 2013-10-24 NOTE — Progress Notes (Signed)
    History of Present Illness: This is a 68 year old male returning for followup of liver disease. He had problems with refractory ascites 2 years ago requiring paracenteses Lasix and Aldactone. He discontinued all alcohol use in 2013 and his ascites resolved. Lasix was discontinued by his primary physician approximately 6 months after I last saw him. Abdominal ultrasound imaging at the time revealed liver changes consistent with cirrhosis. He underwent an abdominal/pelvic CT scan and abdominal MRI in November 2014 and both showed no evidence of cirrhosis or portal hypertension. He received a recall for endoscopy for varices screening.  Current Medications, Allergies, Past Medical History, Past Surgical History, Family History and Social History were reviewed in [employer] record.  Physical Exam: General: Well developed , well nourished, no acute distress Head: Normocephalic and atraumatic Eyes:  sclerae anicteric, EOMI Ears: Normal auditory acuity Mouth: No deformity or lesions Lungs: Clear throughout to auscultation Heart: Regular rate and rhythm; no murmurs, rubs or bruits Abdomen: Soft, non tender and non distended. No masses, hepatosplenomegaly or hernias noted. Normal Bowel sounds Musculoskeletal: Symmetrical with no gross deformities  Pulses:  Normal pulses noted Extremities: No clubbing, cyanosis, edema or deformities noted Neurological: Alert oriented x 4, grossly nonfocal Psychological:  Alert and cooperative. Normal mood and affect  Assessment and Recommendations:  1. Portal hypertension, resolved, which was likely related to alcoholic hepatitis. Although ultrasound imaging in 2013 suggested cirrhosis and he had clear evidence of portal hypertension, more recent abdominal imaging does not suggest cirrhosis or portal hypertension. Discontinue spironolactone. Discontinue screening program for varices. Continue to abstain from alcohol use.  2 Personal history  of adenomatous colon polyps. Surveillance colonoscopy at 4 years is due in July 2016.

## 2013-10-27 IMAGING — US US PARACENTESIS
1 series · 8 of 8 positions shown · non-contrast
Comparison: 05/25/2011

CLINICAL DATA: Cirrhosis and ascites.

ULTRASOUND GUIDED PARACENTESIS

[Series 1: us paracentesis · 0.37mm/px · 8 of 8 slices shown]
[im 1/8]
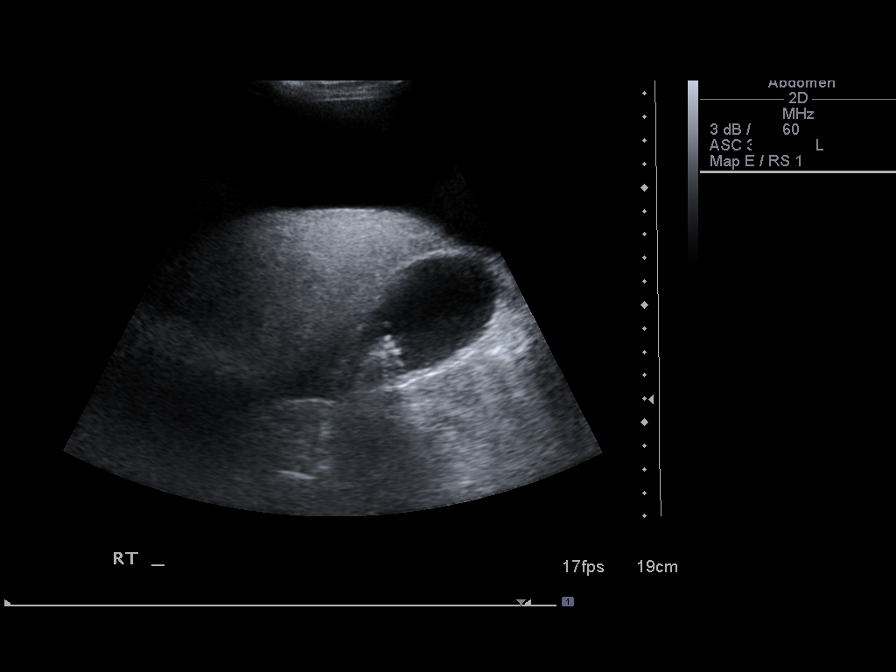
[im 2/8]
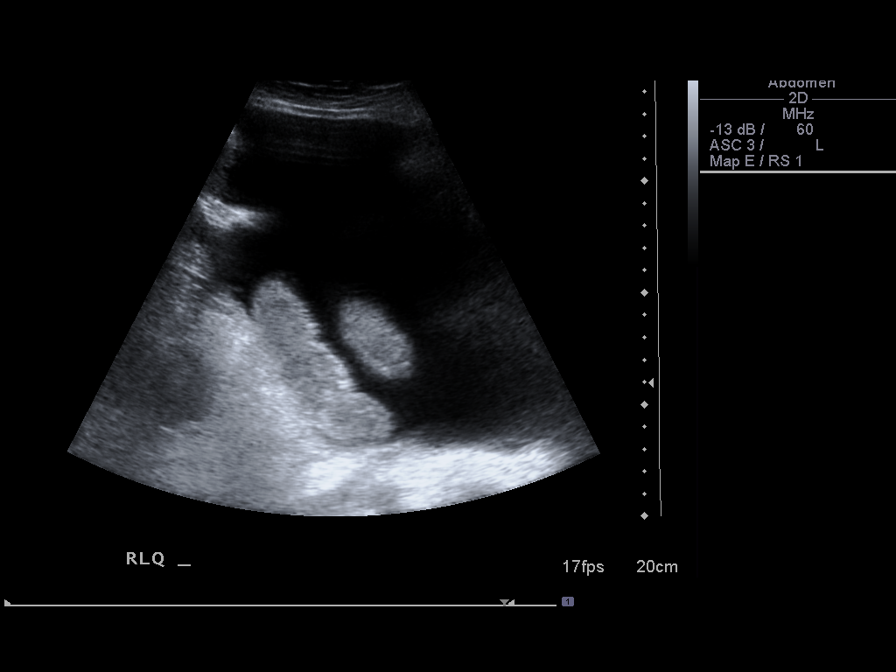
[im 3/8]
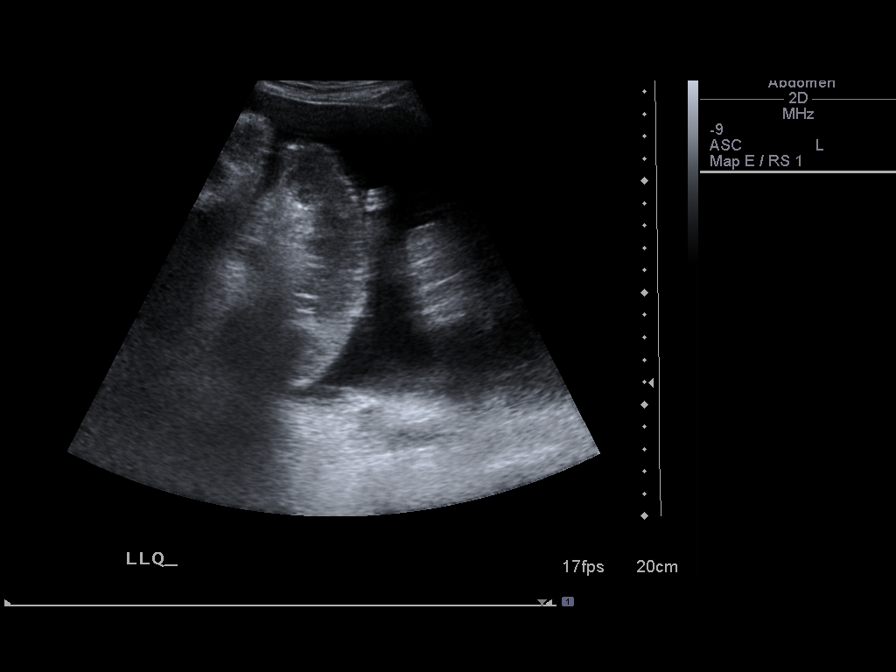
[im 4/8]
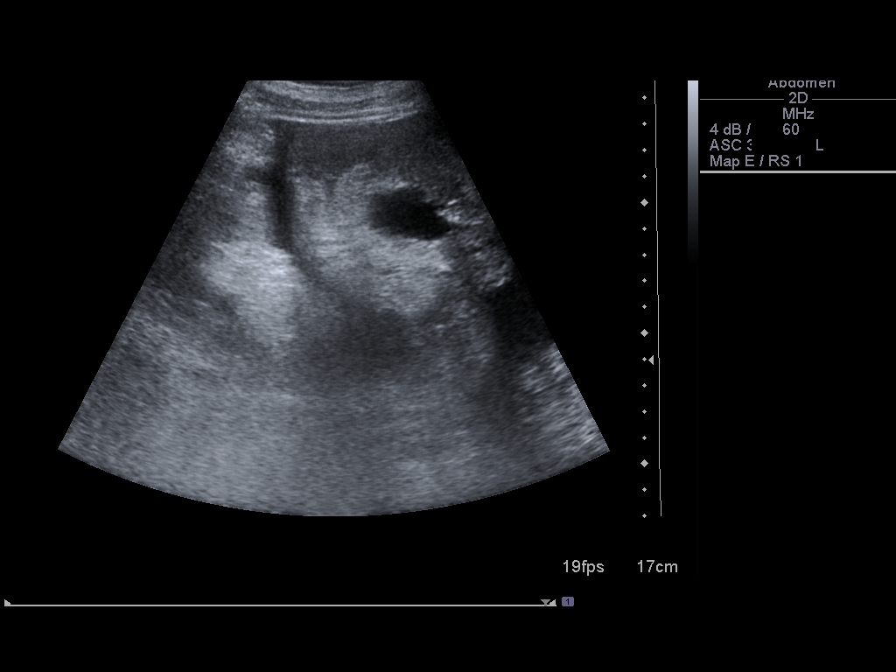
[im 5/8]
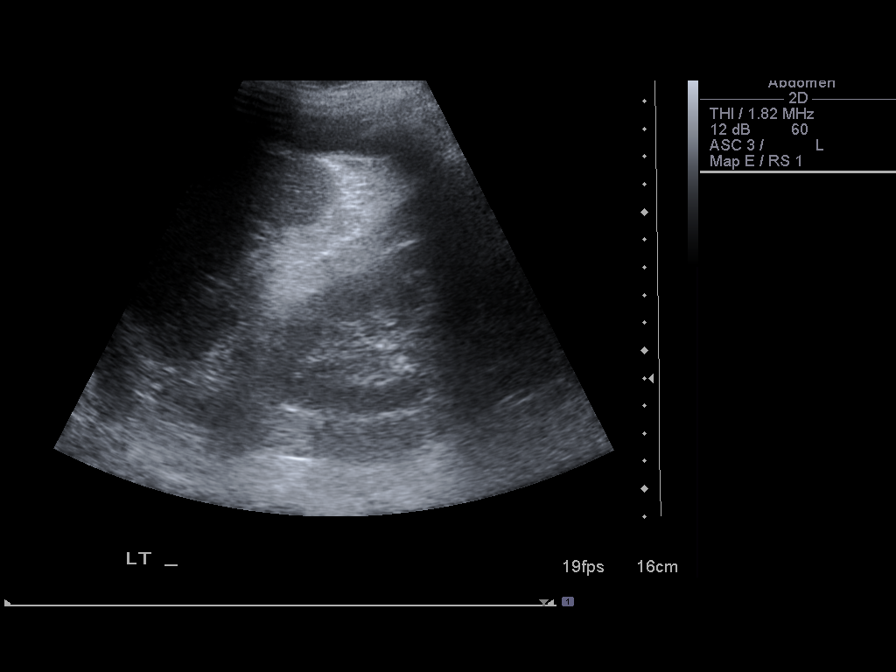
[im 6/8]
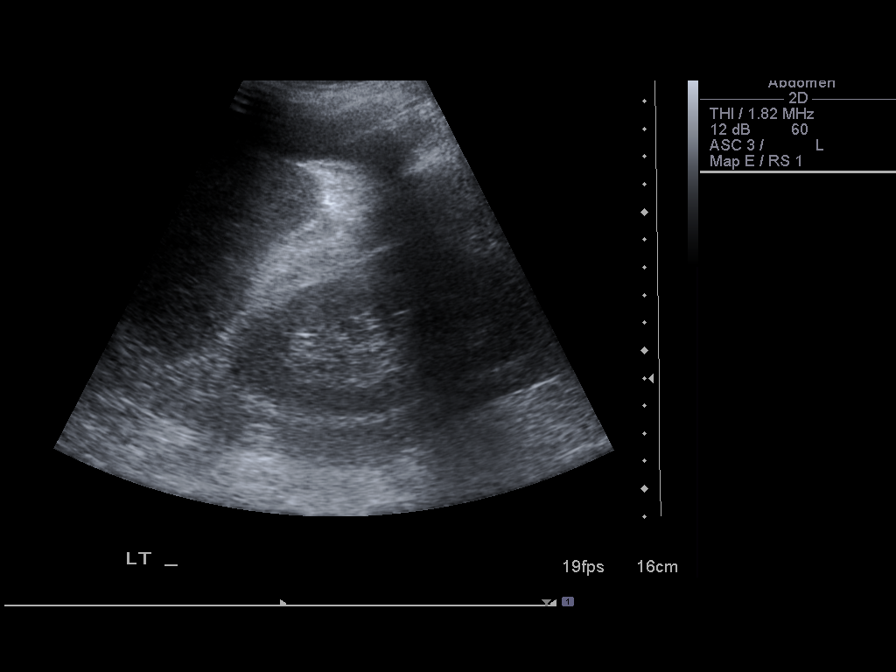
[im 7/8]
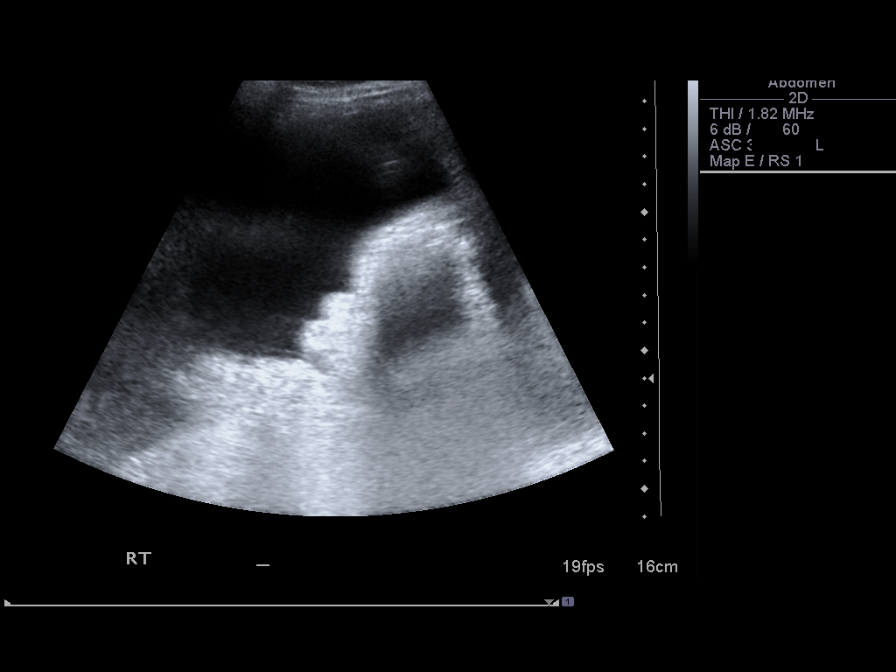
[im 8/8]
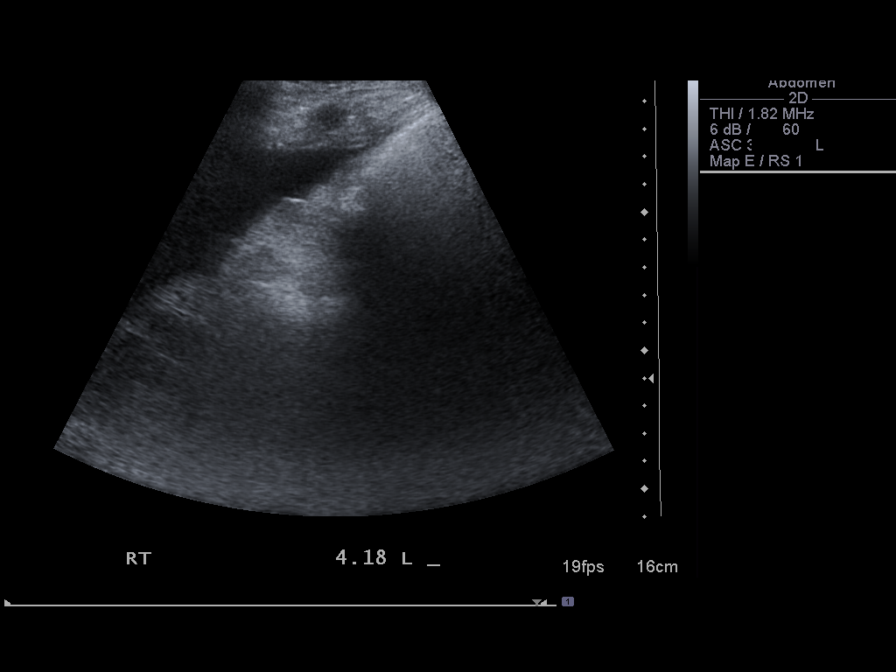

[8 of 8 positions shown; findings below may reference images not displayed]

An ultrasound guided paracentesis was thoroughly discussed with the
patient and questions answered.  The benefits, risks, alternatives
and complications were also discussed.  The patient understands and
wishes to proceed with the procedure.  Written consent was
obtained.

Ultrasound was performed to localize and mark an adequate pocket of
fluid in the right lower quadrant of the abdomen.  The area was
then prepped and draped in the normal sterile fashion.  1%
Lidocaine was used for local anesthesia.  Under ultrasound guidance
a 19 gauge Yueh catheter was introduced.  Paracentesis was
performed.  The catheter was removed and a dressing applied.

Complications:  None
FINDINGS: A total of approximately 4.18 liters of clear yellow
fluid was removed.  A fluid sample was sent for laboratory
analysis.
IMPRESSION: Successful ultrasound guided paracentesis yielding 4.18 liters of
ascites.

## 2014-01-15 IMAGING — US US PARACENTESIS
1 series · 6 of 6 positions shown · non-contrast
Comparison: none

CLINICAL DATA: Cirrhosis, recurrent ascites; request is made for
therapeutic paracentesis.

[Series 1: us paracentesis · 0.32mm/px · 6 of 6 slices shown]
[im 1/6]
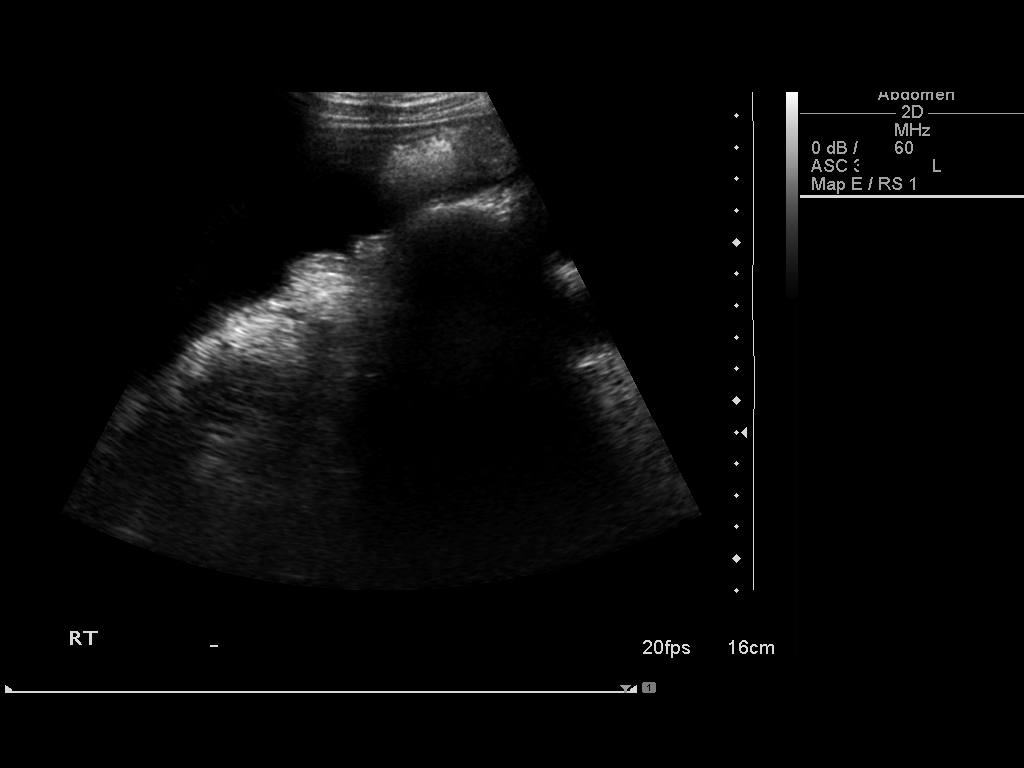
[im 2/6]
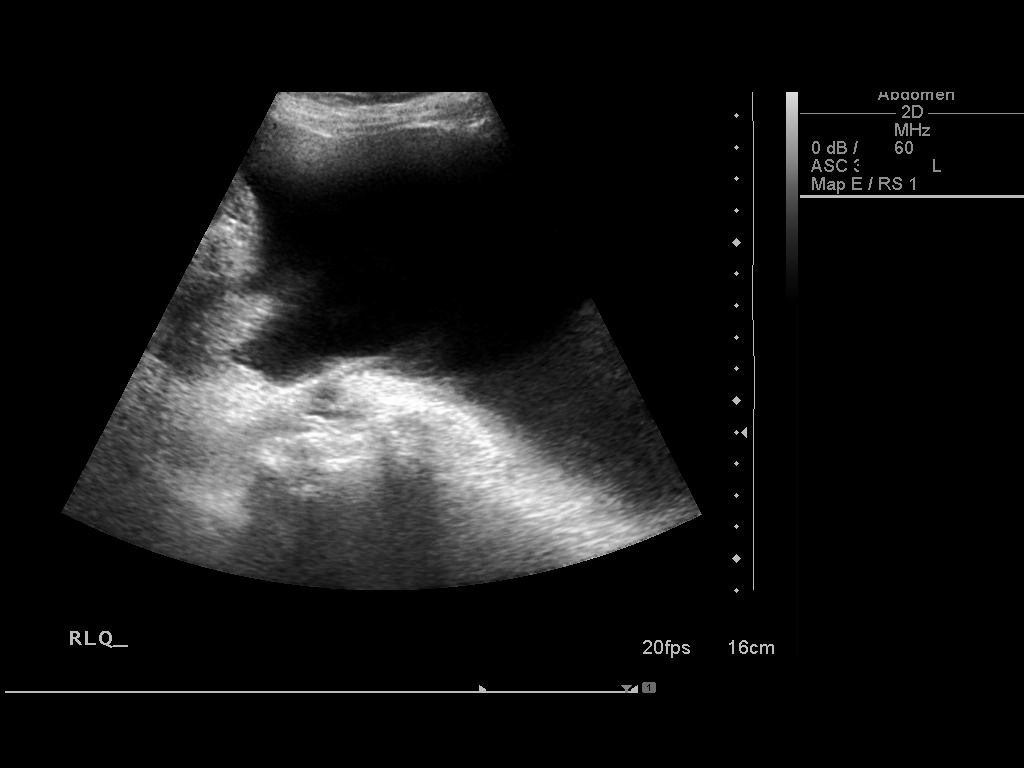
[im 3/6]
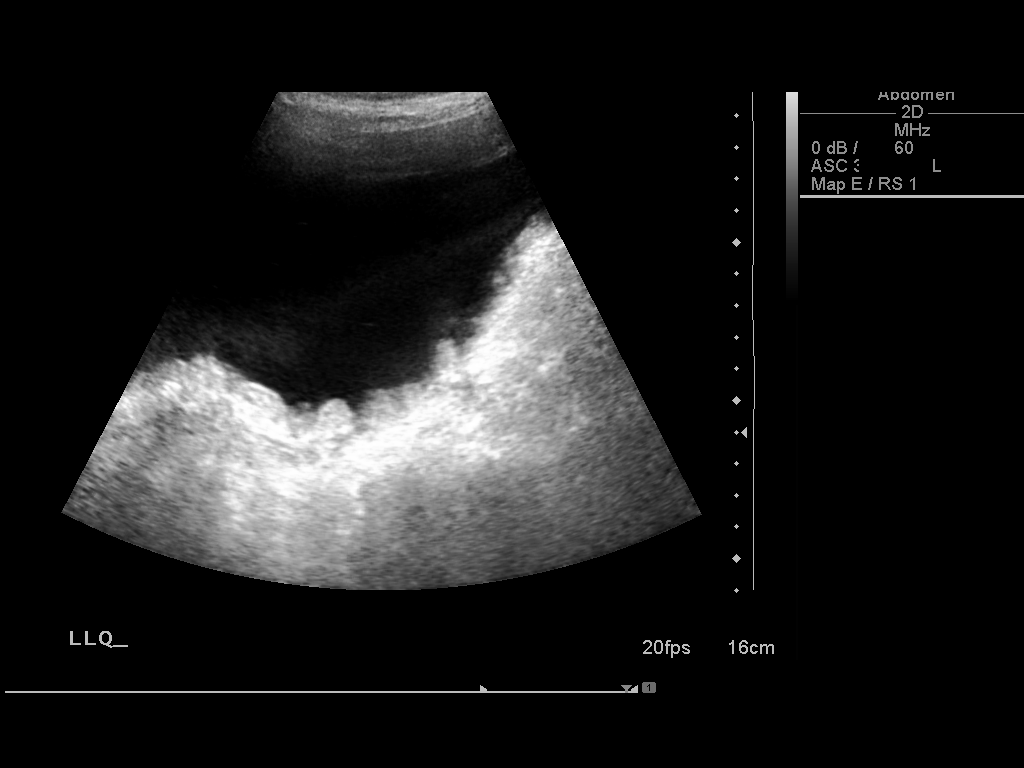
[im 4/6]
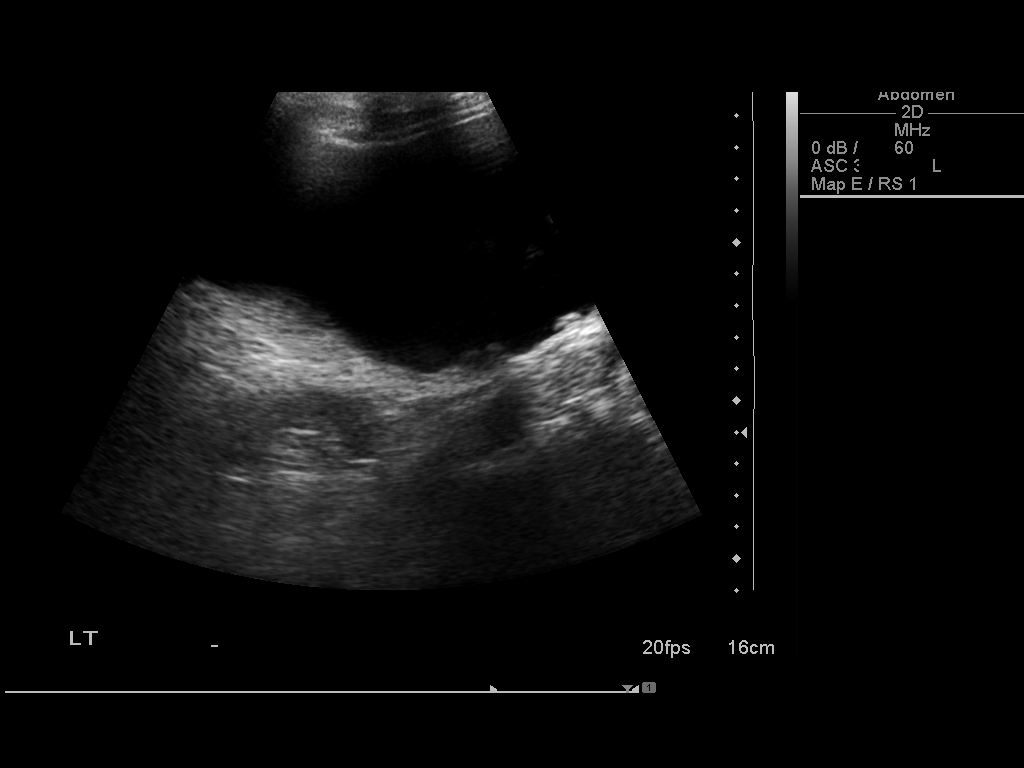
[im 5/6]
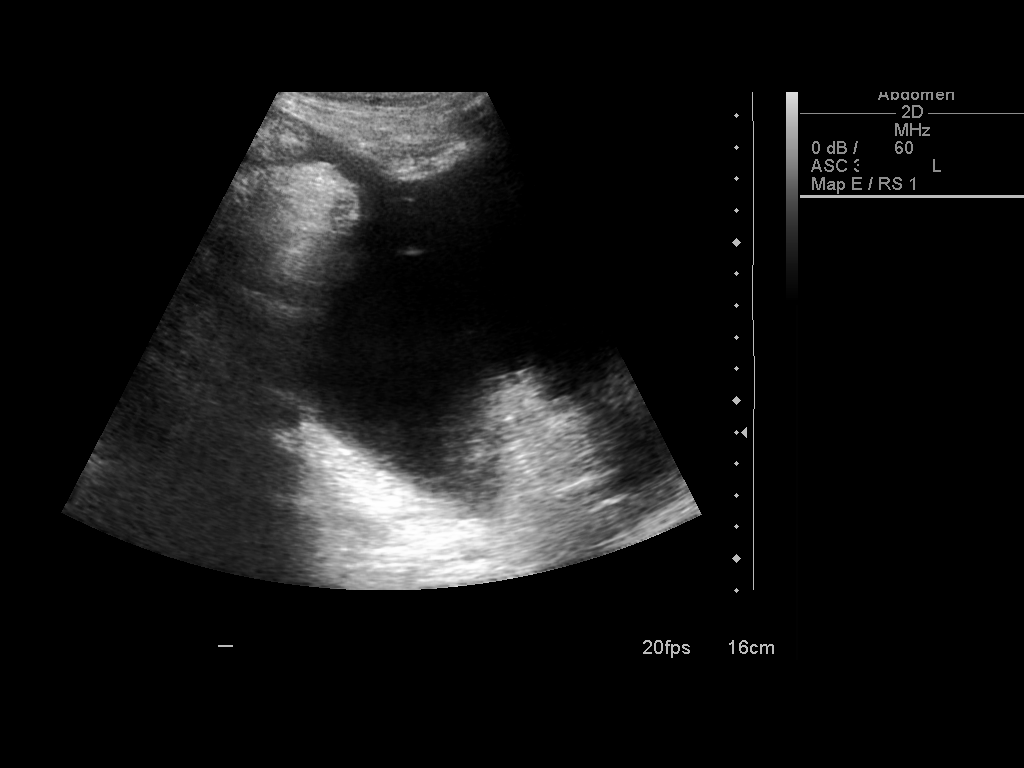
[im 6/6]
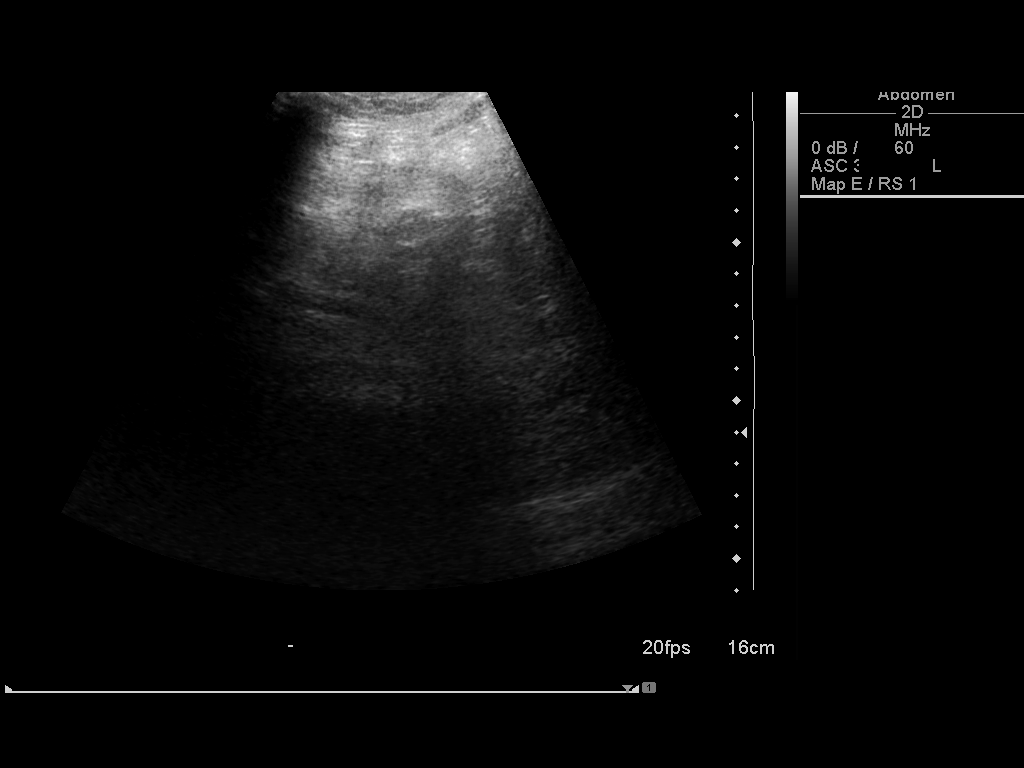

[6 of 6 positions shown; findings below may reference images not displayed]

ULTRASOUND GUIDED THERAPEUTIC  PARACENTESIS

An ultrasound guided paracentesis was thoroughly discussed with the
patient and questions answered.  The benefits, risks, alternatives
and complications were also discussed.  The patient understands and
wishes to proceed with the procedure.  Written consent was
obtained.

Ultrasound was performed to localize and mark an adequate pocket of
fluid in the right lower quadrant of the abdomen.  The area was
then prepped and draped in the normal sterile fashion.  1%
Lidocaine was used for local anesthesia.  Under ultrasound guidance
a 19 gauge Yueh catheter was introduced.  Paracentesis was
performed.  The catheter was removed and a dressing applied.

Complications:  none
FINDINGS: A total of approximately 4.3 liters of yellow fluid was
removed.
IMPRESSION: Successful ultrasound guided therapeutic paracentesis yielding
liters of ascites.

Read by: Tiger, Abimelk.-URJII

## 2014-02-10 DIAGNOSIS — Z0000 Encounter for general adult medical examination without abnormal findings: Secondary | ICD-10-CM | POA: Diagnosis not present

## 2014-02-10 DIAGNOSIS — Z125 Encounter for screening for malignant neoplasm of prostate: Secondary | ICD-10-CM | POA: Diagnosis not present

## 2014-02-10 DIAGNOSIS — R7301 Impaired fasting glucose: Secondary | ICD-10-CM | POA: Diagnosis not present

## 2014-02-10 DIAGNOSIS — R030 Elevated blood-pressure reading, without diagnosis of hypertension: Secondary | ICD-10-CM | POA: Diagnosis not present

## 2014-02-17 ENCOUNTER — Other Ambulatory Visit: Payer: Self-pay | Admitting: Internal Medicine

## 2014-02-17 DIAGNOSIS — Z6821 Body mass index (BMI) 21.0-21.9, adult: Secondary | ICD-10-CM | POA: Diagnosis not present

## 2014-02-17 DIAGNOSIS — Z8719 Personal history of other diseases of the digestive system: Secondary | ICD-10-CM | POA: Diagnosis not present

## 2014-02-17 DIAGNOSIS — F17219 Nicotine dependence, cigarettes, with unspecified nicotine-induced disorders: Secondary | ICD-10-CM

## 2014-02-17 DIAGNOSIS — J984 Other disorders of lung: Secondary | ICD-10-CM | POA: Diagnosis not present

## 2014-02-17 DIAGNOSIS — R030 Elevated blood-pressure reading, without diagnosis of hypertension: Secondary | ICD-10-CM | POA: Diagnosis not present

## 2014-02-17 DIAGNOSIS — R7301 Impaired fasting glucose: Secondary | ICD-10-CM | POA: Diagnosis not present

## 2014-02-17 DIAGNOSIS — Z23 Encounter for immunization: Secondary | ICD-10-CM | POA: Diagnosis not present

## 2014-02-17 DIAGNOSIS — F419 Anxiety disorder, unspecified: Secondary | ICD-10-CM | POA: Diagnosis not present

## 2014-02-17 DIAGNOSIS — F17200 Nicotine dependence, unspecified, uncomplicated: Secondary | ICD-10-CM | POA: Diagnosis not present

## 2014-02-17 DIAGNOSIS — Z1389 Encounter for screening for other disorder: Secondary | ICD-10-CM | POA: Diagnosis not present

## 2014-02-17 DIAGNOSIS — Z0000 Encounter for general adult medical examination without abnormal findings: Secondary | ICD-10-CM | POA: Diagnosis not present

## 2014-02-20 DIAGNOSIS — Z1212 Encounter for screening for malignant neoplasm of rectum: Secondary | ICD-10-CM | POA: Diagnosis not present

## 2014-09-22 ENCOUNTER — Encounter: Payer: Self-pay | Admitting: Gastroenterology

## 2014-11-21 ENCOUNTER — Ambulatory Visit (AMBULATORY_SURGERY_CENTER): Payer: Self-pay | Admitting: *Deleted

## 2014-11-21 VITALS — Ht 72.0 in | Wt 158.8 lb

## 2014-11-21 DIAGNOSIS — Z860100 Personal history of colon polyps, unspecified: Secondary | ICD-10-CM

## 2014-11-21 MED ORDER — NA SULFATE-K SULFATE-MG SULF 17.5-3.13-1.6 GM/177ML PO SOLN: 1.0000 | Freq: Once | ORAL | Status: DC

## 2014-11-21 NOTE — Progress Notes (Signed)
No egg or soy allergy No issues with past sedation No diet pills No home 02 use  emmi declined  

## 2014-12-05 ENCOUNTER — Ambulatory Visit (AMBULATORY_SURGERY_CENTER): Payer: Medicare Other | Admitting: Gastroenterology

## 2014-12-05 ENCOUNTER — Encounter: Payer: Self-pay | Admitting: Gastroenterology

## 2014-12-05 VITALS — BP 112/72 | HR 66 | Temp 96.0°F | Resp 14 | Ht 72.0 in | Wt 158.0 lb

## 2014-12-05 DIAGNOSIS — D120 Benign neoplasm of cecum: Secondary | ICD-10-CM | POA: Diagnosis not present

## 2014-12-05 DIAGNOSIS — D125 Benign neoplasm of sigmoid colon: Secondary | ICD-10-CM | POA: Diagnosis not present

## 2014-12-05 DIAGNOSIS — Z860100 Personal history of colon polyps, unspecified: Secondary | ICD-10-CM

## 2014-12-05 DIAGNOSIS — K7460 Unspecified cirrhosis of liver: Secondary | ICD-10-CM | POA: Diagnosis not present

## 2014-12-05 DIAGNOSIS — R188 Other ascites: Secondary | ICD-10-CM | POA: Diagnosis not present

## 2014-12-05 DIAGNOSIS — K766 Portal hypertension: Secondary | ICD-10-CM | POA: Diagnosis not present

## 2014-12-05 MED ORDER — SODIUM CHLORIDE 0.9 % IV SOLN: 500.0000 mL | INTRAVENOUS | Status: DC

## 2014-12-05 NOTE — Patient Instructions (Signed)
YOU HAD AN ENDOSCOPIC PROCEDURE TODAY AT [facility]:   Refer to the procedure report that was given to you for any specific questions about what was found during the examination.  If the procedure report does not answer your questions, please call your gastroenterologist to clarify.  If you requested that your care partner not be given the details of your procedure findings, then the procedure report has been included in a sealed envelope for you to review at your convenience later.  YOU SHOULD EXPECT: Some feelings of bloating in the abdomen. Passage of more gas than usual.  Walking can help get rid of the air that was put into your GI tract during the procedure and reduce the bloating. If you had a lower endoscopy (such as a colonoscopy or flexible sigmoidoscopy) you may notice spotting of blood in your stool or on the toilet paper. If you underwent a bowel prep for your procedure, you may not have a normal bowel movement for a few days.  Please Note:  You might notice some irritation and congestion in your nose or some drainage.  This is from the oxygen used during your procedure.  There is no need for concern and it should clear up in a day or so.  SYMPTOMS TO REPORT IMMEDIATELY:   Following lower endoscopy (colonoscopy or flexible sigmoidoscopy):  Excessive amounts of blood in the stool  Significant tenderness or worsening of abdominal pains  Swelling of the abdomen that is new, acute  Fever of 100F or higher    For urgent or emergent issues, a gastroenterologist can be reached at any hour by calling [phone].   DIET: Your first meal following the procedure should be a small meal and then it is ok to progress to your normal diet. Heavy or fried foods are harder to digest and may make you feel nauseous or bloated.  Likewise, meals heavy in dairy and vegetables can increase bloating.  Drink plenty of fluids but you should avoid alcoholic beverages for 24  hours.  ACTIVITY:  You should plan to take it easy for the rest of today and you should NOT DRIVE or use heavy machinery until tomorrow (because of the sedation medicines used during the test).    FOLLOW UP: Our staff will call the number listed on your records the next business day following your procedure to check on you and address any questions or concerns that you may have regarding the information given to you following your procedure. If we do not reach you, we will leave a message.  However, if you are feeling well and you are not experiencing any problems, there is no need to return our call.  We will assume that you have returned to your regular daily activities without incident.  If any biopsies were taken you will be contacted by phone or by letter within the next 1-3 weeks.  Please call us at [phone] if you have not heard about the biopsies in 3 weeks.    SIGNATURES/CONFIDENTIALITY: You and/or your care partner have signed paperwork which will be entered into your electronic medical record.  These signatures attest to the fact that that the information above on your After Visit Summary has been reviewed and is understood.  Full responsibility of the confidentiality of this discharge information lies with you and/or your care-partner.   Hold Aspirin and all other NSAIDS for 2 weeks. Information given on polyps,diverticulosis,hemorrhoids and high fiber diet.

## 2014-12-05 NOTE — Progress Notes (Signed)
Report to PACU, RN, vss, BBS= Clear.  

## 2014-12-05 NOTE — Op Note (Signed)
[facility]  [address] [place], [address]   COLONOSCOPY PROCEDURE REPORT PATIENT: [name], [name]  MR#: [id number] BIRTHDATE: [birth year] , 69  yrs. old GENDER: male ENDOSCOPIST: Ladene Artist, MD, [facility] PROCEDURE DATE:  12/05/2014 PROCEDURE:   Colonoscopy, surveillance and Colonoscopy with snare polypectomy First Screening Colonoscopy - Avg.  risk and is 50 yrs.  old or older - No.  Prior Negative Screening - Now for repeat screening. N/A  History of Adenoma - Now for follow-up colonoscopy & has been > or = to 3 yrs.  Yes hx of adenoma.  Has been 3 or more years since last colonoscopy.  Polyps removed today? Yes ASA CLASS:   Class III INDICATIONS:Surveillance due to prior colonic neoplasia and PH Colon Adenoma. MEDICATIONS: Monitored anesthesia care and Propofol 250 mg IV DESCRIPTION OF PROCEDURE:   After the risks benefits and alternatives of the procedure were thoroughly explained, informed consent was obtained.  The digital rectal exam revealed no abnormalities of the rectum.   The LB [id number]-H190 [id number]  endoscope was introduced through the anus and advanced to the cecum, which was identified by both the appendix and ileocecal valve. No adverse events experienced.   The quality of the prep was adequate (Suprep was used)  The instrument was then slowly withdrawn as the colon was fully examined. Estimated blood loss is zero unless otherwise noted in this procedure report.  COLON FINDINGS: Two sessile polyps measuring 6 mm in size were found in the sigmoid colon and at the cecum.  Polypectomies were performed with a cold snare.  The resection was complete, the polyp tissue was completely retrieved and sent to histology.   A semi-pedunculated polyp measuring 8 mm in size was found in the sigmoid colon.  A polypectomy was performed using snare cautery. The resection was complete, the polyp tissue was completely retrieved and sent to histology.  There was mild  diverticulosis noted in the descending colon, transverse colon, and ascending colon. There was moderate diverticulosis noted in the sigmoid colon with associated colonic narrowing, colonic spasm and muscular hypertrophy.  The examination was otherwise normal.  Retroflexed views revealed internal Grade I hemorrhoids. The time to cecum = 1.6 Withdrawal time = 17.9   The scope was withdrawn and the procedure completed. COMPLICATIONS: There were no immediate complications. ENDOSCOPIC IMPRESSION: 1.   Two sessile polyps in the sigmoid colon and at the cecum; polypectomies performed with a cold snare 2.   Semi-pedunculated polyp in the sigmoid colon; polypectomy performed using snare cautery 3.   Mild diverticulosis was noted in the descending colon, transverse colon, and ascending colon 4.   Moderate diverticulosis noted in the sigmoid colon 5.   Grade l internal hemorrhoids RECOMMENDATIONS: 1.  Hold Aspirin and all other NSAIDS for 2 weeks. 2.  Await pathology results 3.  High fiber diet with liberal fluid intake. 4.  Repeat Colonoscopy in 5 years with a more extensive bowel prep.  eSigned:  Ladene Artist, MD, [facility] 12/05/2014 2:06 PM  cc: Lutricia Feil, MD

## 2014-12-05 NOTE — Progress Notes (Signed)
Called to room to assist during endoscopic procedure.  Patient ID and intended procedure confirmed with present staff. Received instructions for my participation in the procedure from the performing physician.  

## 2014-12-06 ENCOUNTER — Telehealth: Payer: Self-pay | Admitting: *Deleted

## 2014-12-06 NOTE — Telephone Encounter (Signed)
  Follow up Call-  Call back number 12/05/2014  Post procedure Call Back phone  # [phone]  Permission to leave phone message Yes     Patient questions:  Do you have a fever, pain , or abdominal swelling? No. Pain Score  0 *  Have you tolerated food without any problems? Yes.    Have you been able to return to your normal activities? Yes.    Do you have any questions about your discharge instructions: Diet   No. Medications  No. Follow up visit  No.  Do you have questions or concerns about your Care? No.  Actions: * If pain score is 4 or above: No action needed, pain <4.

## 2014-12-16 ENCOUNTER — Encounter: Payer: Self-pay | Admitting: Gastroenterology

## 2015-02-12 DIAGNOSIS — Z23 Encounter for immunization: Secondary | ICD-10-CM | POA: Diagnosis not present

## 2015-02-14 DIAGNOSIS — Z0000 Encounter for general adult medical examination without abnormal findings: Secondary | ICD-10-CM | POA: Diagnosis not present

## 2015-02-14 DIAGNOSIS — Z125 Encounter for screening for malignant neoplasm of prostate: Secondary | ICD-10-CM | POA: Diagnosis not present

## 2015-02-14 DIAGNOSIS — N390 Urinary tract infection, site not specified: Secondary | ICD-10-CM | POA: Diagnosis not present

## 2015-02-14 DIAGNOSIS — R7301 Impaired fasting glucose: Secondary | ICD-10-CM | POA: Diagnosis not present

## 2015-02-14 DIAGNOSIS — R030 Elevated blood-pressure reading, without diagnosis of hypertension: Secondary | ICD-10-CM | POA: Diagnosis not present

## 2015-02-21 DIAGNOSIS — Z0000 Encounter for general adult medical examination without abnormal findings: Secondary | ICD-10-CM | POA: Diagnosis not present

## 2015-02-21 DIAGNOSIS — Z1389 Encounter for screening for other disorder: Secondary | ICD-10-CM | POA: Diagnosis not present

## 2015-02-21 DIAGNOSIS — R030 Elevated blood-pressure reading, without diagnosis of hypertension: Secondary | ICD-10-CM | POA: Diagnosis not present

## 2015-02-21 DIAGNOSIS — F17200 Nicotine dependence, unspecified, uncomplicated: Secondary | ICD-10-CM | POA: Diagnosis not present

## 2015-02-21 DIAGNOSIS — Z8719 Personal history of other diseases of the digestive system: Secondary | ICD-10-CM | POA: Diagnosis not present

## 2015-02-21 DIAGNOSIS — R7301 Impaired fasting glucose: Secondary | ICD-10-CM | POA: Diagnosis not present

## 2015-02-21 DIAGNOSIS — Z6823 Body mass index (BMI) 23.0-23.9, adult: Secondary | ICD-10-CM | POA: Diagnosis not present

## 2015-02-21 DIAGNOSIS — F419 Anxiety disorder, unspecified: Secondary | ICD-10-CM | POA: Diagnosis not present

## 2015-02-23 DIAGNOSIS — Z1212 Encounter for screening for malignant neoplasm of rectum: Secondary | ICD-10-CM | POA: Diagnosis not present

## 2015-04-20 ENCOUNTER — Encounter: Payer: Self-pay | Admitting: Acute Care

## 2015-04-23 ENCOUNTER — Other Ambulatory Visit: Payer: Self-pay | Admitting: Acute Care

## 2015-04-23 DIAGNOSIS — F17210 Nicotine dependence, cigarettes, uncomplicated: Principal | ICD-10-CM

## 2015-04-24 ENCOUNTER — Ambulatory Visit
Admission: RE | Admit: 2015-04-24 | Discharge: 2015-04-24 | Disposition: A | Discharge status: Home / Self Care | Payer: Medicare Other | Source: Ambulatory Visit | Attending: Acute Care | Admitting: Acute Care

## 2015-04-24 ENCOUNTER — Encounter: Payer: Medicare Other | Admitting: Acute Care

## 2015-04-24 DIAGNOSIS — F17210 Nicotine dependence, cigarettes, uncomplicated: Principal | ICD-10-CM

## 2015-04-24 DIAGNOSIS — Z87891 Personal history of nicotine dependence: Secondary | ICD-10-CM | POA: Diagnosis not present

## 2015-04-25 ENCOUNTER — Other Ambulatory Visit: Payer: Self-pay | Admitting: Acute Care

## 2015-04-25 DIAGNOSIS — F17210 Nicotine dependence, cigarettes, uncomplicated: Principal | ICD-10-CM

## 2015-07-25 ENCOUNTER — Other Ambulatory Visit: Payer: Self-pay | Admitting: Acute Care

## 2015-07-25 DIAGNOSIS — F17210 Nicotine dependence, cigarettes, uncomplicated: Principal | ICD-10-CM

## 2015-08-15 ENCOUNTER — Encounter: Payer: Self-pay | Admitting: Gastroenterology

## 2016-01-29 DIAGNOSIS — Z23 Encounter for immunization: Secondary | ICD-10-CM | POA: Diagnosis not present

## 2016-02-15 DIAGNOSIS — I10 Essential (primary) hypertension: Secondary | ICD-10-CM | POA: Diagnosis not present

## 2016-02-15 DIAGNOSIS — R7301 Impaired fasting glucose: Secondary | ICD-10-CM | POA: Diagnosis not present

## 2016-02-15 DIAGNOSIS — R8299 Other abnormal findings in urine: Secondary | ICD-10-CM | POA: Diagnosis not present

## 2016-02-15 DIAGNOSIS — Z125 Encounter for screening for malignant neoplasm of prostate: Secondary | ICD-10-CM | POA: Diagnosis not present

## 2016-02-15 DIAGNOSIS — R358 Other polyuria: Secondary | ICD-10-CM | POA: Diagnosis not present

## 2016-02-22 DIAGNOSIS — Z1389 Encounter for screening for other disorder: Secondary | ICD-10-CM | POA: Diagnosis not present

## 2016-02-22 DIAGNOSIS — Z6841 Body Mass Index (BMI) 40.0 and over, adult: Secondary | ICD-10-CM | POA: Diagnosis not present

## 2016-02-22 DIAGNOSIS — R740 Nonspecific elevation of levels of transaminase and lactic acid dehydrogenase [LDH]: Secondary | ICD-10-CM | POA: Diagnosis not present

## 2016-02-22 DIAGNOSIS — G4709 Other insomnia: Secondary | ICD-10-CM | POA: Diagnosis not present

## 2016-02-22 DIAGNOSIS — Z125 Encounter for screening for malignant neoplasm of prostate: Secondary | ICD-10-CM | POA: Diagnosis not present

## 2016-02-22 DIAGNOSIS — F17200 Nicotine dependence, unspecified, uncomplicated: Secondary | ICD-10-CM | POA: Diagnosis not present

## 2016-02-22 DIAGNOSIS — G250 Essential tremor: Secondary | ICD-10-CM | POA: Diagnosis not present

## 2016-02-22 DIAGNOSIS — R030 Elevated blood-pressure reading, without diagnosis of hypertension: Secondary | ICD-10-CM | POA: Diagnosis not present

## 2016-02-22 DIAGNOSIS — Z8719 Personal history of other diseases of the digestive system: Secondary | ICD-10-CM | POA: Diagnosis not present

## 2016-02-22 DIAGNOSIS — F418 Other specified anxiety disorders: Secondary | ICD-10-CM | POA: Diagnosis not present

## 2016-02-22 DIAGNOSIS — Z0000 Encounter for general adult medical examination without abnormal findings: Secondary | ICD-10-CM | POA: Diagnosis not present

## 2016-02-22 DIAGNOSIS — R7301 Impaired fasting glucose: Secondary | ICD-10-CM | POA: Diagnosis not present

## 2016-03-07 DIAGNOSIS — Z1212 Encounter for screening for malignant neoplasm of rectum: Secondary | ICD-10-CM | POA: Diagnosis not present

## 2016-04-24 ENCOUNTER — Ambulatory Visit: Payer: Medicare Other

## 2016-05-07 ENCOUNTER — Ambulatory Visit
Admission: RE | Admit: 2016-05-07 | Discharge: 2016-05-07 | Disposition: A | Discharge status: Home / Self Care | Payer: Medicare Other | Source: Ambulatory Visit | Attending: Acute Care | Admitting: Acute Care

## 2016-05-07 DIAGNOSIS — Z87891 Personal history of nicotine dependence: Secondary | ICD-10-CM | POA: Diagnosis not present

## 2016-05-07 DIAGNOSIS — F17210 Nicotine dependence, cigarettes, uncomplicated: Principal | ICD-10-CM

## 2016-05-13 ENCOUNTER — Other Ambulatory Visit: Payer: Self-pay | Admitting: Acute Care

## 2016-05-13 ENCOUNTER — Telehealth: Payer: Self-pay | Admitting: Acute Care

## 2016-05-13 DIAGNOSIS — F17210 Nicotine dependence, cigarettes, uncomplicated: Secondary | ICD-10-CM

## 2016-05-13 NOTE — Telephone Encounter (Signed)
Spoke with pt, aware of results/recs.  Follow up ct ordered for 05/2017 per SG recs.  Nothing further needed.

## 2016-12-29 DIAGNOSIS — H40033 Anatomical narrow angle, bilateral: Secondary | ICD-10-CM | POA: Diagnosis not present

## 2016-12-29 DIAGNOSIS — H2513 Age-related nuclear cataract, bilateral: Secondary | ICD-10-CM | POA: Diagnosis not present

## 2017-02-10 DIAGNOSIS — Z23 Encounter for immunization: Secondary | ICD-10-CM | POA: Diagnosis not present

## 2017-02-20 DIAGNOSIS — R740 Nonspecific elevation of levels of transaminase and lactic acid dehydrogenase [LDH]: Secondary | ICD-10-CM | POA: Diagnosis not present

## 2017-02-20 DIAGNOSIS — Z125 Encounter for screening for malignant neoplasm of prostate: Secondary | ICD-10-CM | POA: Diagnosis not present

## 2017-02-20 DIAGNOSIS — R82998 Other abnormal findings in urine: Secondary | ICD-10-CM | POA: Diagnosis not present

## 2017-02-20 DIAGNOSIS — R7301 Impaired fasting glucose: Secondary | ICD-10-CM | POA: Diagnosis not present

## 2017-02-20 DIAGNOSIS — Z79899 Other long term (current) drug therapy: Secondary | ICD-10-CM | POA: Diagnosis not present

## 2017-02-20 DIAGNOSIS — R799 Abnormal finding of blood chemistry, unspecified: Secondary | ICD-10-CM | POA: Diagnosis not present

## 2017-02-20 DIAGNOSIS — Z0000 Encounter for general adult medical examination without abnormal findings: Secondary | ICD-10-CM | POA: Diagnosis not present

## 2017-02-27 DIAGNOSIS — R030 Elevated blood-pressure reading, without diagnosis of hypertension: Secondary | ICD-10-CM | POA: Diagnosis not present

## 2017-02-27 DIAGNOSIS — F17200 Nicotine dependence, unspecified, uncomplicated: Secondary | ICD-10-CM | POA: Diagnosis not present

## 2017-02-27 DIAGNOSIS — R7301 Impaired fasting glucose: Secondary | ICD-10-CM | POA: Diagnosis not present

## 2017-02-27 DIAGNOSIS — G250 Essential tremor: Secondary | ICD-10-CM | POA: Diagnosis not present

## 2017-02-27 DIAGNOSIS — R740 Nonspecific elevation of levels of transaminase and lactic acid dehydrogenase [LDH]: Secondary | ICD-10-CM | POA: Diagnosis not present

## 2017-02-27 DIAGNOSIS — F419 Anxiety disorder, unspecified: Secondary | ICD-10-CM | POA: Diagnosis not present

## 2017-02-27 DIAGNOSIS — G4700 Insomnia, unspecified: Secondary | ICD-10-CM | POA: Diagnosis not present

## 2017-02-27 DIAGNOSIS — Z8719 Personal history of other diseases of the digestive system: Secondary | ICD-10-CM | POA: Diagnosis not present

## 2017-02-27 DIAGNOSIS — Z0000 Encounter for general adult medical examination without abnormal findings: Secondary | ICD-10-CM | POA: Diagnosis not present

## 2017-02-27 DIAGNOSIS — Z1389 Encounter for screening for other disorder: Secondary | ICD-10-CM | POA: Diagnosis not present

## 2017-02-27 DIAGNOSIS — Z6823 Body mass index (BMI) 23.0-23.9, adult: Secondary | ICD-10-CM | POA: Diagnosis not present

## 2017-03-02 DIAGNOSIS — Z1212 Encounter for screening for malignant neoplasm of rectum: Secondary | ICD-10-CM | POA: Diagnosis not present

## 2017-04-09 ENCOUNTER — Other Ambulatory Visit: Payer: Self-pay | Admitting: Acute Care

## 2017-04-09 DIAGNOSIS — Z122 Encounter for screening for malignant neoplasm of respiratory organs: Secondary | ICD-10-CM

## 2017-04-09 DIAGNOSIS — F17210 Nicotine dependence, cigarettes, uncomplicated: Principal | ICD-10-CM

## 2017-05-08 ENCOUNTER — Ambulatory Visit: Payer: Medicare Other

## 2017-05-11 ENCOUNTER — Ambulatory Visit
Admission: RE | Admit: 2017-05-11 | Discharge: 2017-05-11 | Disposition: A | Discharge status: Home / Self Care | Payer: Medicare Other | Source: Ambulatory Visit | Attending: Acute Care | Admitting: Acute Care

## 2017-05-11 DIAGNOSIS — Z122 Encounter for screening for malignant neoplasm of respiratory organs: Secondary | ICD-10-CM

## 2017-05-11 DIAGNOSIS — J439 Emphysema, unspecified: Secondary | ICD-10-CM | POA: Diagnosis not present

## 2017-05-11 DIAGNOSIS — F17210 Nicotine dependence, cigarettes, uncomplicated: Secondary | ICD-10-CM

## 2017-05-22 ENCOUNTER — Other Ambulatory Visit: Payer: Self-pay | Admitting: Acute Care

## 2017-05-22 DIAGNOSIS — Z122 Encounter for screening for malignant neoplasm of respiratory organs: Secondary | ICD-10-CM

## 2017-05-22 DIAGNOSIS — F17210 Nicotine dependence, cigarettes, uncomplicated: Principal | ICD-10-CM

## 2018-01-18 DIAGNOSIS — Z23 Encounter for immunization: Secondary | ICD-10-CM | POA: Diagnosis not present

## 2018-03-03 DIAGNOSIS — Z79899 Other long term (current) drug therapy: Secondary | ICD-10-CM | POA: Diagnosis not present

## 2018-03-03 DIAGNOSIS — Z125 Encounter for screening for malignant neoplasm of prostate: Secondary | ICD-10-CM | POA: Diagnosis not present

## 2018-03-03 DIAGNOSIS — R82998 Other abnormal findings in urine: Secondary | ICD-10-CM | POA: Diagnosis not present

## 2018-03-03 DIAGNOSIS — R7301 Impaired fasting glucose: Secondary | ICD-10-CM | POA: Diagnosis not present

## 2018-03-10 DIAGNOSIS — I700 Atherosclerosis of aorta: Secondary | ICD-10-CM | POA: Diagnosis not present

## 2018-03-10 DIAGNOSIS — Z125 Encounter for screening for malignant neoplasm of prostate: Secondary | ICD-10-CM | POA: Diagnosis not present

## 2018-03-10 DIAGNOSIS — I2510 Atherosclerotic heart disease of native coronary artery without angina pectoris: Secondary | ICD-10-CM | POA: Diagnosis not present

## 2018-03-10 DIAGNOSIS — Z0000 Encounter for general adult medical examination without abnormal findings: Secondary | ICD-10-CM | POA: Diagnosis not present

## 2018-03-10 DIAGNOSIS — Z6823 Body mass index (BMI) 23.0-23.9, adult: Secondary | ICD-10-CM | POA: Diagnosis not present

## 2018-03-10 DIAGNOSIS — R972 Elevated prostate specific antigen [PSA]: Secondary | ICD-10-CM | POA: Diagnosis not present

## 2018-03-10 DIAGNOSIS — R7301 Impaired fasting glucose: Secondary | ICD-10-CM | POA: Diagnosis not present

## 2018-03-10 DIAGNOSIS — J439 Emphysema, unspecified: Secondary | ICD-10-CM | POA: Diagnosis not present

## 2018-03-10 DIAGNOSIS — Z1389 Encounter for screening for other disorder: Secondary | ICD-10-CM | POA: Diagnosis not present

## 2018-03-10 DIAGNOSIS — R740 Nonspecific elevation of levels of transaminase and lactic acid dehydrogenase [LDH]: Secondary | ICD-10-CM | POA: Diagnosis not present

## 2018-03-10 DIAGNOSIS — R030 Elevated blood-pressure reading, without diagnosis of hypertension: Secondary | ICD-10-CM | POA: Diagnosis not present

## 2018-03-10 DIAGNOSIS — G250 Essential tremor: Secondary | ICD-10-CM | POA: Diagnosis not present

## 2018-03-17 DIAGNOSIS — Z1212 Encounter for screening for malignant neoplasm of rectum: Secondary | ICD-10-CM | POA: Diagnosis not present

## 2018-05-13 ENCOUNTER — Ambulatory Visit: Payer: Medicare Other

## 2018-05-20 ENCOUNTER — Ambulatory Visit
Admission: RE | Admit: 2018-05-20 | Discharge: 2018-05-20 | Disposition: A | Discharge status: Home / Self Care | Payer: Medicare Other | Source: Ambulatory Visit | Attending: Acute Care | Admitting: Acute Care

## 2018-05-20 DIAGNOSIS — Z122 Encounter for screening for malignant neoplasm of respiratory organs: Secondary | ICD-10-CM

## 2018-05-20 DIAGNOSIS — F17210 Nicotine dependence, cigarettes, uncomplicated: Secondary | ICD-10-CM

## 2018-05-21 ENCOUNTER — Other Ambulatory Visit: Payer: Self-pay | Admitting: Acute Care

## 2018-05-21 DIAGNOSIS — F17210 Nicotine dependence, cigarettes, uncomplicated: Principal | ICD-10-CM

## 2018-05-21 DIAGNOSIS — Z122 Encounter for screening for malignant neoplasm of respiratory organs: Secondary | ICD-10-CM

## 2018-05-21 DIAGNOSIS — Z87891 Personal history of nicotine dependence: Secondary | ICD-10-CM

## 2018-09-28 ENCOUNTER — Encounter (HOSPITAL_COMMUNITY): Payer: Self-pay

## 2018-09-28 ENCOUNTER — Emergency Department (HOSPITAL_COMMUNITY)
Admission: EM | Admit: 2018-09-28 | Discharge: 2018-09-28 | Disposition: A | Discharge status: Home / Self Care | Payer: Medicare Other | Attending: Emergency Medicine | Admitting: Emergency Medicine

## 2018-09-28 ENCOUNTER — Other Ambulatory Visit: Payer: Self-pay

## 2018-09-28 DIAGNOSIS — K7030 Alcoholic cirrhosis of liver without ascites: Secondary | ICD-10-CM | POA: Insufficient documentation

## 2018-09-28 DIAGNOSIS — F17210 Nicotine dependence, cigarettes, uncomplicated: Secondary | ICD-10-CM | POA: Diagnosis not present

## 2018-09-28 DIAGNOSIS — Z7901 Long term (current) use of anticoagulants: Secondary | ICD-10-CM | POA: Diagnosis not present

## 2018-09-28 DIAGNOSIS — R110 Nausea: Secondary | ICD-10-CM | POA: Diagnosis not present

## 2018-09-28 DIAGNOSIS — R000 Tachycardia, unspecified: Secondary | ICD-10-CM | POA: Diagnosis not present

## 2018-09-28 DIAGNOSIS — D696 Thrombocytopenia, unspecified: Secondary | ICD-10-CM | POA: Insufficient documentation

## 2018-09-28 DIAGNOSIS — R58 Hemorrhage, not elsewhere classified: Secondary | ICD-10-CM | POA: Diagnosis not present

## 2018-09-28 DIAGNOSIS — R040 Epistaxis: Secondary | ICD-10-CM

## 2018-09-28 LAB — CBC WITH DIFFERENTIAL/PLATELET
Abs Immature Granulocytes: 0.07 10*3/uL (ref 0.00–0.07)
Basophils Absolute: 0.1 10*3/uL (ref 0.0–0.1)
Basophils Relative: 1 %
Eosinophils Absolute: 0 10*3/uL (ref 0.0–0.5)
Eosinophils Relative: 0 %
HCT: 35.8 % — ABNORMAL LOW (ref 39.0–52.0)
Hemoglobin: 12 g/dL — ABNORMAL LOW (ref 13.0–17.0)
Immature Granulocytes: 1 %
Lymphocytes Relative: 21 %
Lymphs Abs: 1.4 10*3/uL (ref 0.7–4.0)
MCH: 33 pg (ref 26.0–34.0)
MCHC: 33.5 g/dL (ref 30.0–36.0)
MCV: 98.4 fL (ref 80.0–100.0)
Monocytes Absolute: 0.5 10*3/uL (ref 0.1–1.0)
Monocytes Relative: 7 %
Neutro Abs: 4.8 10*3/uL (ref 1.7–7.7)
Neutrophils Relative %: 70 %
Platelets: 173 10*3/uL (ref 150–400)
RBC: 3.64 MIL/uL — ABNORMAL LOW (ref 4.22–5.81)
RDW: 12.5 % (ref 11.5–15.5)
WBC: 6.9 10*3/uL (ref 4.0–10.5)
nRBC: 0 % (ref 0.0–0.2)

## 2018-09-28 LAB — BASIC METABOLIC PANEL
Anion gap: 12 (ref 5–15)
BUN: 29 mg/dL — ABNORMAL HIGH (ref 8–23)
CO2: 22 mmol/L (ref 22–32)
Calcium: 9.9 mg/dL (ref 8.9–10.3)
Chloride: 100 mmol/L (ref 98–111)
Creatinine, Ser: 0.75 mg/dL (ref 0.61–1.24)
GFR calc Af Amer: >60 mL/min (ref >60)
GFR calc non Af Amer: >60 mL/min (ref >60)
Glucose, Bld: 119 mg/dL — ABNORMAL HIGH (ref 70–99)
Potassium: 5 mmol/L (ref 3.5–5.1)
Sodium: 134 mmol/L — ABNORMAL LOW (ref 135–145)

## 2018-09-28 MED ORDER — OXYMETAZOLINE HCL 0.05 % NA SOLN
1.0000 | Freq: Once | NASAL | Status: AC
  Administered 2018-09-28: 15:00:00 1 via NASAL
  Filled 2018-09-28: qty 30

## 2018-09-28 NOTE — ED Triage Notes (Signed)
Pt arrives to ED via EMS with c/o intermittent epistaxis x 6 days. Pt denies any trauma or injury to nose. Per EMS pt is not on blood thinners. Epistaxis controlled on arrival. EMS VSS en route.

## 2018-09-28 NOTE — ED Provider Notes (Signed)
[facility] EMER[facility]ENC[facility] DEPARTMENT Provider Note   CSN: [id number] Arrival date & time: 09/28/18  1408    History   Chief Complaint Chief Complaint  Patient presents with  . Epistaxis    HPI [name] is a 72 y.o. male with past medical history significant for cirrhosis, alcoholism in remission, anxiety, seasonal allergies, chronic thrombocytopenia presents for evaluation of epistaxis.  Patient states he has had intermittent nosebleed over the last 6 days.  Pain located to his left nares. States bleeding stops with pressure applied. States he can "see the bleeding." Denies history of posterior nasal bleeding. Denies prior epistaxis requiring packing. He is not followed by ENT. He is current on Plavix. Denies injury or trauma to face or nose. Denies fever, chills, nausea, emesis, HA, neck pain, neck stiffness, CP, SOB, dizziness, lightheadedness. Denies any additional aggravating or alleviating factors.   History obtained from patient.  No interpreter was used.    HPI  Past Medical History:  Diagnosis Date  . Alcoholic cirrhosis of liver with ascites ([place])   . Alcoholism ([place])   . Allergy    spring occasionally  . Anxiety disorder   . Diverticulosis   . Elevated liver function tests   . Impaired fasting glucose   . Internal hemorrhoids   . Tubular adenoma of colon 11/2009  . White coat hypertension     Patient Active Problem List   Diagnosis Date Noted  . Hyponatremia 05/28/2011  . Thrombocytopenia ([place]) 05/28/2011  . Heme positive stool 05/25/2011  . Alcoholism ([place]) 05/25/2011  . Anxiety disorder 05/25/2011  . Severe protein-calorie malnutrition ([place]) 05/25/2011    Past Surgical History:  Procedure Laterality Date  . COLONOSCOPY  11/2009   2011 - 3 adenomas, diverticulosis, hemorhoids  . ESOPHAGOGASTRODUODENOSCOPY  05/26/2011   Procedure: ESOPHAGOGASTRODUODENOSCOPY (EGD);  Surgeon: Scarlette Shorts, MD;  Location: [facility] END[facility]COPY;  Service: Endoscopy;   Laterality: N/A;  . HERNIA REPAIR    . POLYPECTOMY    . UPPER GASTROINTESTINAL ENDOSCOPY    . WRIST FRACTURE SURGERY Right    2008 or 09        Home Medications    Prior to Admission medications   Not on File    Family History Family History  Problem Relation Age of Onset  . Lymphoma Father        died 16  . Melanoma Daughter        twice  . Colon cancer Neg Hx   . Colon polyps Neg Hx   . Rectal cancer Neg Hx   . Stomach cancer Neg Hx     Social History Social History   Tobacco Use  . Smoking status: Current Some Day Smoker    Packs/day: 1.00    Years: 48.00    Pack years: 48.00    Types: Cigarettes  . Smokeless tobacco: Never Used  . Tobacco comment: 5 cigarettes a day   Substance Use Topics  . Alcohol use: Yes    Alcohol/week: 17.0 - 24.0 standard drinks    Types: 3 Glasses of wine, 14 - 21 Standard drinks or equivalent per week    Comment: once a week  . Drug use: No     Allergies   Patient has no known allergies.   Review of Systems Review of Systems  Constitutional: Negative.   HENT: Positive for nosebleeds. Negative for congestion, dental problem, drooling, ear discharge, ear pain, facial swelling, hearing loss, postnasal drip, rhinorrhea, sinus pressure, sinus pain, sneezing, sore throat,  tinnitus, trouble swallowing and voice change.   Eyes: Negative.   Respiratory: Negative.   Cardiovascular: Negative.   Gastrointestinal: Negative.   Genitourinary: Negative.   Musculoskeletal: Negative.   Skin: Negative.   Neurological: Negative.   All other systems reviewed and are negative.    Physical Exam Updated Vital Signs BP (!) 101/91   Pulse 89   Temp 98.8 F (37.1 C) (Oral)   Resp 16   Ht 6' (1.829 m)   Wt 74.8 kg   SpO2 100%   BMI 22.38 kg/m   Physical Exam Vitals signs and nursing note reviewed.  Constitutional:      General: He is not in acute distress.    Appearance: He is not ill-appearing, toxic-appearing or diaphoretic.   HENT:     Head: Normocephalic and atraumatic.     Jaw: There is normal jaw occlusion.     Right Ear: Tympanic membrane, ear canal and external ear normal. There is no impacted cerumen. No hemotympanum. Tympanic membrane is not injected, scarred, perforated, erythematous, retracted or bulging.     Left Ear: Tympanic membrane, ear canal and external ear normal. There is no impacted cerumen. No hemotympanum. Tympanic membrane is not injected, scarred, perforated, erythematous, retracted or bulging.     Ears:     Comments: No Mastoid tenderness.    Nose: Nose normal. No nasal deformity, septal deviation, signs of injury, laceration, nasal tenderness, mucosal edema, congestion or rhinorrhea.     Right Nostril: No foreign body, epistaxis, septal hematoma or occlusion.     Left Nostril: No foreign body, epistaxis, septal hematoma or occlusion.     Right Sinus: No maxillary sinus tenderness or frontal sinus tenderness.     Left Sinus: No maxillary sinus tenderness or frontal sinus tenderness.     Comments: Clear rhinorrhea and congestion to bilateral nares.  No sinus tenderness.  No active bleeding.    Mouth/Throat:     Comments: Posterior oropharynx clear.  Mucous membranes moist.  Tonsils without erythema or exudate.  Uvula midline without deviation.  No evidence of PTA or RPA.  No drooling, dysphasia or trismus.  Phonation normal. Eyes:     Comments: No horizontal, vertical or rotational nystagmus   Neck:     Musculoskeletal: Normal range of motion and neck supple. No neck rigidity.     Trachea: Trachea and phonation normal.     Meningeal: Brudzinski's sign and Kernig's sign absent.     Comments: No Neck stiffness or neck rigidity.  No meningismus.  No cervical lymphadenopathy. Cardiovascular:     Rate and Rhythm: Normal rate.     Pulses: Normal pulses.     Heart sounds: Normal heart sounds. No murmur. No friction rub.     Comments: No murmurs rubs or gallops. Pulmonary:     Effort: Pulmonary  effort is normal. No respiratory distress.     Breath sounds: Normal breath sounds. No stridor. No wheezing, rhonchi or rales.     Comments: Clear to auscultation bilaterally without wheeze, rhonchi or rales.  No accessory muscle usage.  Able speak in full sentences. Chest:     Chest wall: No tenderness.  Abdominal:     General: Bowel sounds are normal. There is no distension.     Tenderness: There is no abdominal tenderness. There is no right CVA tenderness, left CVA tenderness, guarding or rebound.     Comments: Soft, nontender without rebound or guarding.  No CVA tenderness.  Musculoskeletal: Normal range of motion.  Comments: Moves all 4 extremities without difficulty.  Lower extremities without edema, erythema or warmth.  Skin:    General: Skin is warm.     Capillary Refill: Capillary refill takes less than 2 seconds.     Comments: Brisk capillary refill.  No rashes or lesions.  Neurological:     General: No focal deficit present.     Mental Status: He is alert. Mental status is at baseline.     Motor: No weakness.     Comments: Ambulatory in department without difficulty.  Cranial nerves II through XII grossly intact.  No facial droop.  No aphasia. Mental Status:  Alert, oriented, thought content appropriate. Speech fluent without evidence of aphasia. Able to follow 2 step commands without difficulty.  Cranial Nerves:  II:  Peripheral visual fields grossly normal, pupils equal, round, reactive to light III,IV, VI: ptosis not present, extra-ocular motions intact bilaterally  V,VII: smile symmetric, facial light touch sensation equal VIII: hearing grossly normal bilaterally  IX,X: midline uvula rise  XI: bilateral shoulder shrug equal and strong XII: midline tongue extension  Motor:  5/5 in upper and lower extremities bilaterally including strong and equal grip strength and dorsiflexion/plantar flexion Sensory: Pinprick and light touch normal in all extremities.  Deep Tendon  Reflexes: 2+ and symmetric  Cerebellar: normal finger-to-nose with bilateral upper extremities Gait: normal gait and balance CV: distal pulses palpable throughout       ED Treatments / Results  Labs (all labs ordered are listed, but only abnormal results are displayed) Labs Reviewed  CBC WITH DIFFERENTIAL/PLATELET - Abnormal; Notable for the following components:      Result Value   RBC 3.64 (*)    Hemoglobin 12.0 (*)    HCT 35.8 (*)    All other components within normal limits  BASIC METABOLIC PANEL - Abnormal; Notable for the following components:   Sodium 134 (*)    Glucose, Bld 119 (*)    BUN 29 (*)    All other components within normal limits    EKG None  Radiology No results found.  Procedures .Epistaxis Management Date/Time: 09/28/2018 4:57 PM Performed by: Nettie Elm, PA-C Authorized by: Nettie Elm, PA-C   Consent:    Consent obtained:  Verbal   Consent given by:  Patient   Risks discussed:  Bleeding, infection, nasal injury and pain   Alternatives discussed:  No treatment, delayed treatment, alternative treatment, observation and referral Anesthesia (see MAR for exact dosages):    Anesthesia method:  None Procedure details:    Treatment site:  L anterior   Treatment method:  Silver nitrate   Treatment complexity:  Limited   Treatment episode: initial   Post-procedure details:    Assessment:  Bleeding stopped   Patient tolerance of procedure:  Tolerated well, no immediate complications   (including critical care time)  Medications Ordered in ED Medications  oxymetazoline (AFRIN) 0.05 % nasal spray 1 spray (1 spray Each Nare Given 09/28/18 1454)     Initial Impression / Assessment and Plan / ED Course  I have reviewed the triage vital signs and the nursing notes.  Pertinent labs & imaging results that were available during my care of the patient were reviewed by me and considered in my medical decision making (see chart for details).   60 old male appears otherwise well presents for evaluation epistaxis.  Afebrile, nonseptic, non-ill-appearing.  Intermittently to left nares over the last 6 days.  He is on Plavix.  Denies aspirin  or NSAID use.  No prior history of epistaxis.  No lightheadedness or dizziness.  No active bleeding initial exam.  No recent injury or trauma to nares.  No history of hypertension.  Heart and lungs clear.  No neck stiffness or neck rigidity.  No facial tenderness.  No hypertension, tachycardia or HA.  Moves all 4 extremities without difficulty.  Neurovascularly intact with nonfocal neuro exam without deficits.  On reevaluation patient with mild slow ooze to anterior left nares.  Patient able to blow nose without difficulty.  No evidence of clots.  Left nares bleeding stopped with silver nitrate. Patient observed x1 hour without additional bleeding.  He is ambulatory without difficulty.  Denies any dizziness or lightheadedness.  Labs personally reviewed and at patient's baseline.   At DC patient reevaluated.  No bleeding.  Patient to follow-up with ENT or return to the ED with any new or worsening symtpoms  Patient is hemodynamically stable and in no acute distress.  Patient able to ambulate in department prior to ED.  Evaluation does not show acute pathology that would require ongoing or additional emergent interventions while in the emergency department or further inpatient treatment.  I have discussed the diagnosis with the patient and answered all questions.  Patient has no further complaints prior to discharge.  Patient is comfortable with plan discussed in room and is stable for discharge at this time.  I have discussed strict return precautions for returning to the emergency department.  Patient was encouraged to follow-up with PCP/specialist refer to at discharge.     Final Clinical Impressions(s) / ED Diagnoses   Final diagnoses:  Epistaxis    ED Discharge Orders    None       Ebony Yorio,  Dempsey Ahonen A, PA-C 09/28/18 1702    Virgel Manifold, MD 09/30/18 1420

## 2018-09-28 NOTE — Discharge Instructions (Signed)
Use Afrin if your nose started to bleed again. If unable to stop the bleeding after 30 minutes, return to the ED for reevaluation.

## 2018-09-28 NOTE — ED Notes (Signed)
Pt sitting on side of stretcher to use urinal.  No bleeding noted at this time

## 2018-10-01 DIAGNOSIS — R040 Epistaxis: Secondary | ICD-10-CM | POA: Diagnosis not present

## 2018-10-10 IMAGING — CT CT CHEST LUNG CANCER SCREENING LOW DOSE W/O CM
1 of 5 series · 15 of 40 positions shown, 19 images · non-contrast
Comparison: 04/24/2015

CLINICAL DATA: 70-year-old male with 49 pack year history of
smoking. Lung cancer screening.

EXAM:
CT CHEST WITHOUT CONTRAST LOW-DOSE FOR LUNG CANCER SCREENING
TECHNIQUE: Multidetector CT imaging of the chest was performed following the
standard protocol without IV contrast.

[Series 3: lung windows · axial · 0.70mm/px · z∈[-254,+32]mm · 15 of 257 slices shown, 19 images]
[im 14/257  mediastinal]
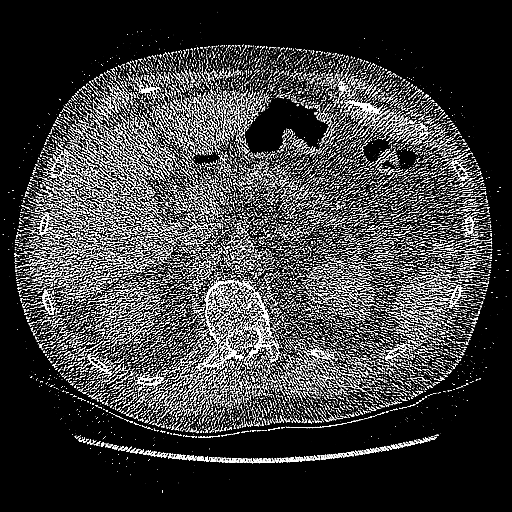
[im 14/257  lung]
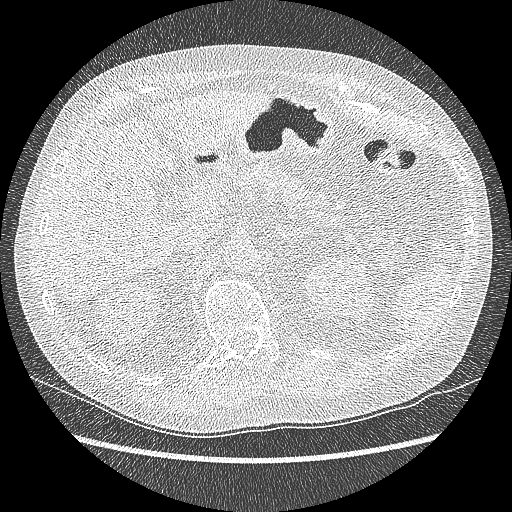
[im 27/257  lung]
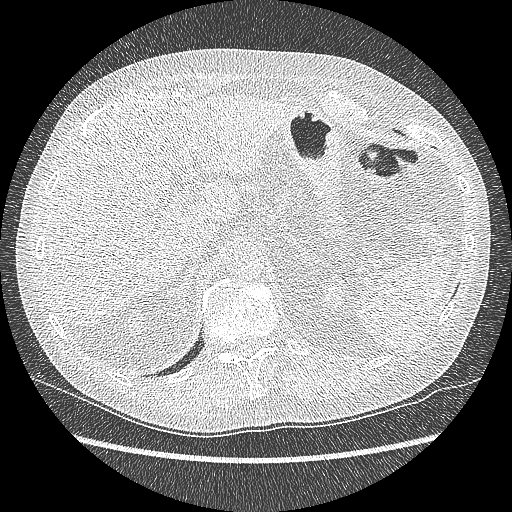
[im 54/257  lung]
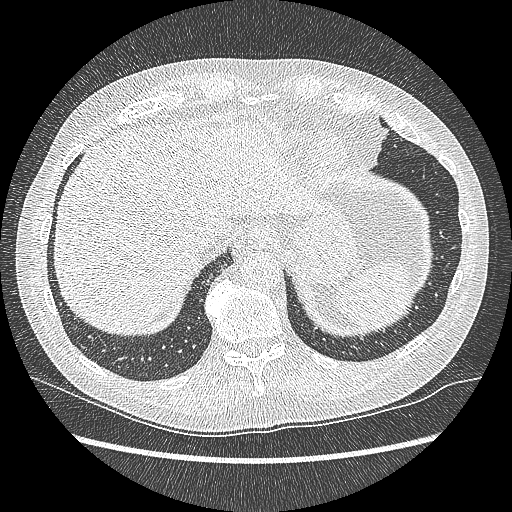
[im 68/257  lung]
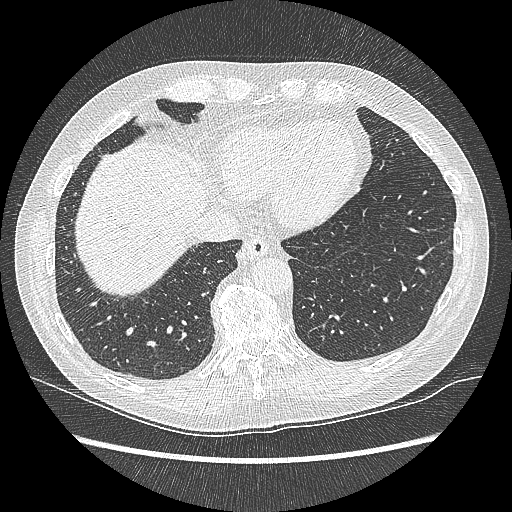
[im 81/257  mediastinal]
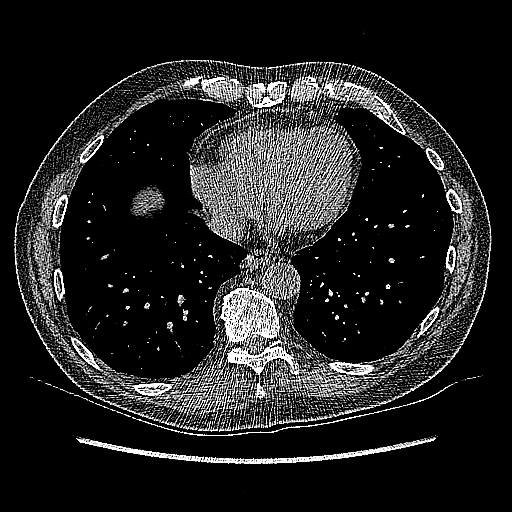
[im 81/257  lung]
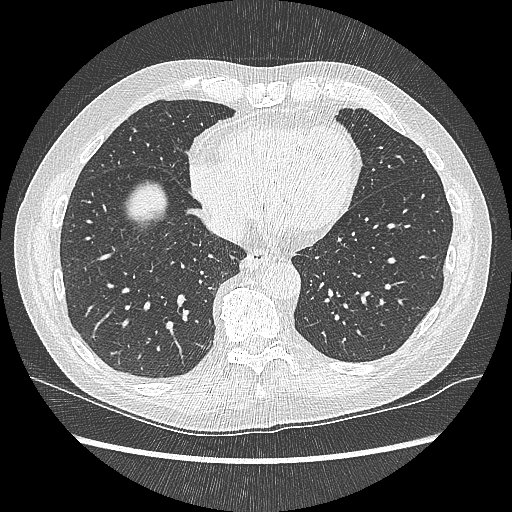
[im 95/257  lung]
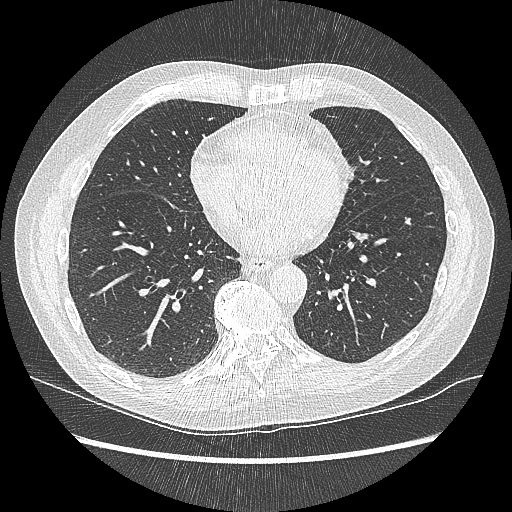
[im 108/257  lung]
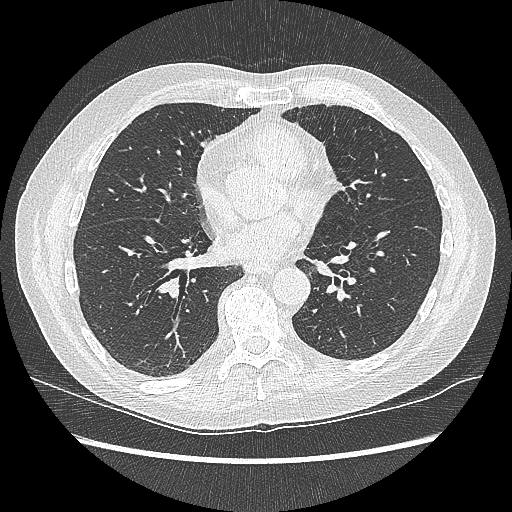
[im 135/257  lung]
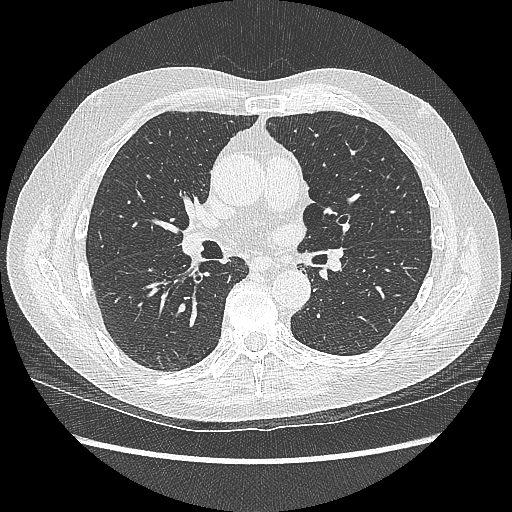
[im 149/257  mediastinal]
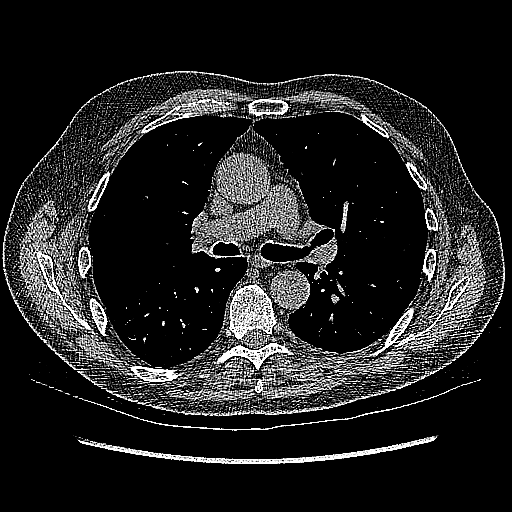
[im 149/257  lung]
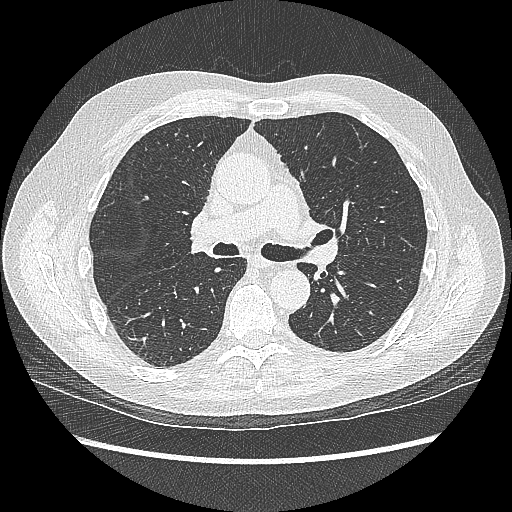
[im 162/257  lung]
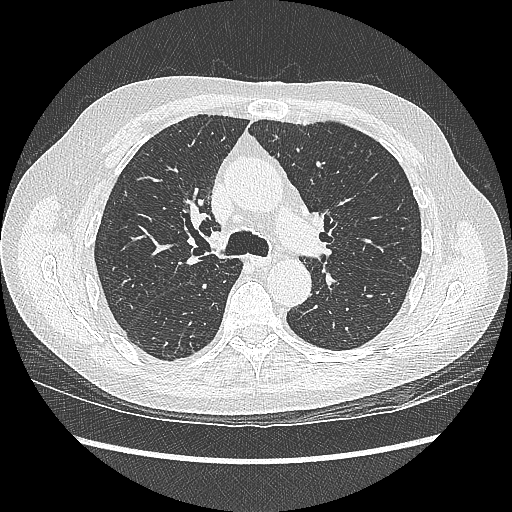
[im 176/257  lung]
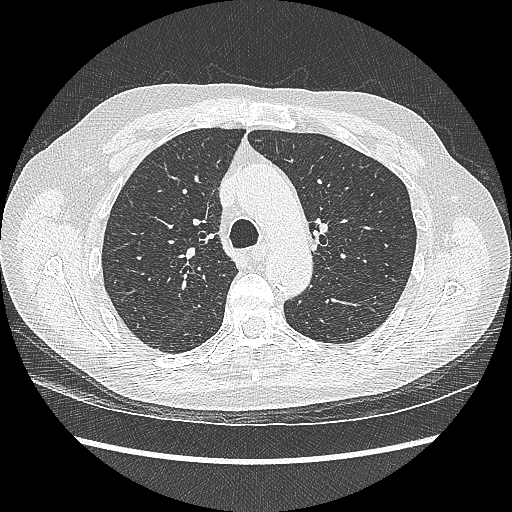
[im 189/257  lung]
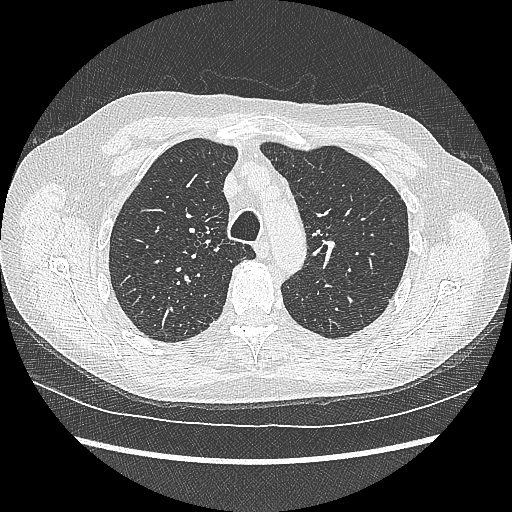
[im 216/257  mediastinal]
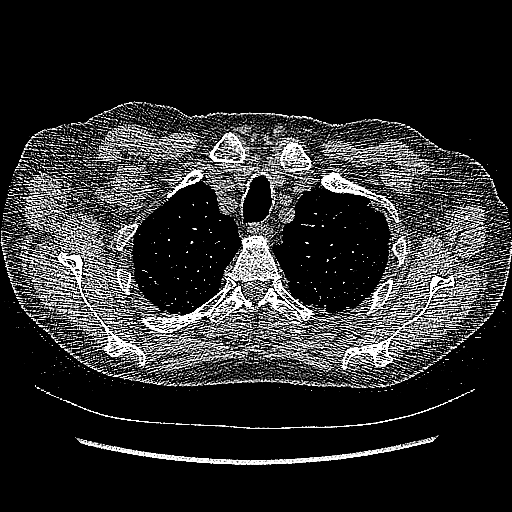
[im 216/257  lung]
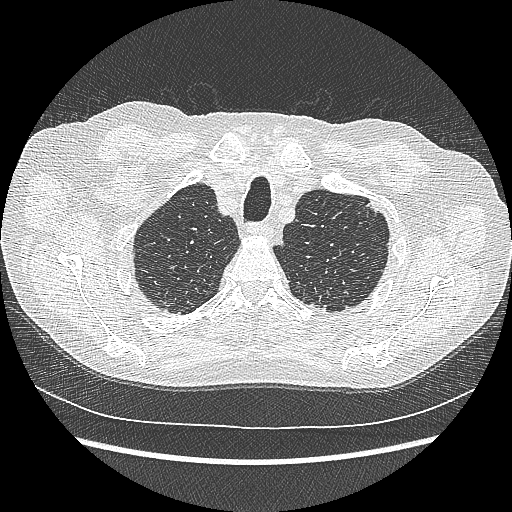
[im 230/257  lung]
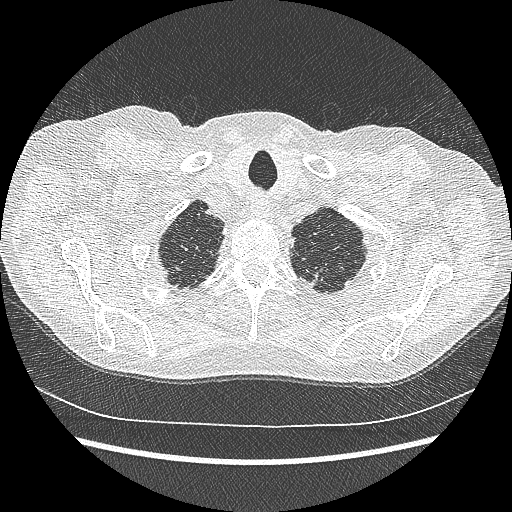
[im 243/257  lung]
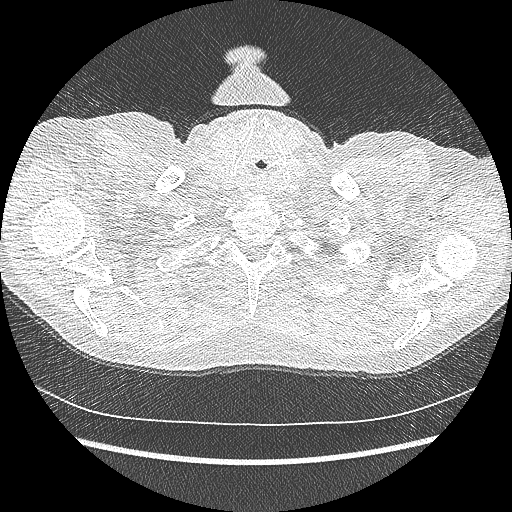

[15 of 40 positions shown; findings below may reference images not displayed]

FINDINGS: Cardiovascular: The heart size is normal. No pericardial effusion.
Coronary artery calcification is noted. Atherosclerotic
calcification is noted in the wall of the thoracic aorta.

Mediastinum/Nodes: No mediastinal lymphadenopathy. No evidence for
gross hilar lymphadenopathy although assessment is limited by the
lack of intravenous contrast on today's study. The esophagus has
normal imaging features. There is no axillary lymphadenopathy.

Lungs/Pleura: Centrilobular and paraseptal emphysema noted with
bronchial wall thickening. Scattered areas of bronchiectasis are
evident. Biapical pleural-parenchymal scarring is stable. Scattered
tiny bilateral pulmonary nodules are again noted, without change. No
focal airspace consolidation. No pulmonary edema or pleural
effusion.

Upper Abdomen: Calcified gallstones noted. 17 mm exophytic lesion
upper pole left kidney incompletely visualized but appears stable in
size. Lesion measures water attenuation suggesting cyst.

Musculoskeletal: Bone windows reveal no worrisome lytic or sclerotic
osseous lesions.
IMPRESSION: 1. Lung-RADS Category 2, benign appearance or behavior. Continue
annual screening with low-dose chest CT without contrast in 12
months.
2. Emphysema.
3. Cholelithiasis.
4. Coronary artery and thoracoabdominal aortic atherosclerosis.

## 2019-01-24 DIAGNOSIS — Z23 Encounter for immunization: Secondary | ICD-10-CM | POA: Diagnosis not present

## 2019-03-10 DIAGNOSIS — R7401 Elevation of levels of liver transaminase levels: Secondary | ICD-10-CM | POA: Diagnosis not present

## 2019-03-10 DIAGNOSIS — Z125 Encounter for screening for malignant neoplasm of prostate: Secondary | ICD-10-CM | POA: Diagnosis not present

## 2019-03-10 DIAGNOSIS — I700 Atherosclerosis of aorta: Secondary | ICD-10-CM | POA: Diagnosis not present

## 2019-03-10 DIAGNOSIS — R7301 Impaired fasting glucose: Secondary | ICD-10-CM | POA: Diagnosis not present

## 2019-03-17 DIAGNOSIS — I2510 Atherosclerotic heart disease of native coronary artery without angina pectoris: Secondary | ICD-10-CM | POA: Diagnosis not present

## 2019-03-17 DIAGNOSIS — F17200 Nicotine dependence, unspecified, uncomplicated: Secondary | ICD-10-CM | POA: Diagnosis not present

## 2019-03-17 DIAGNOSIS — Z8719 Personal history of other diseases of the digestive system: Secondary | ICD-10-CM | POA: Diagnosis not present

## 2019-03-17 DIAGNOSIS — F419 Anxiety disorder, unspecified: Secondary | ICD-10-CM | POA: Diagnosis not present

## 2019-03-17 DIAGNOSIS — J439 Emphysema, unspecified: Secondary | ICD-10-CM | POA: Diagnosis not present

## 2019-03-17 DIAGNOSIS — Z1331 Encounter for screening for depression: Secondary | ICD-10-CM | POA: Diagnosis not present

## 2019-03-17 DIAGNOSIS — G4700 Insomnia, unspecified: Secondary | ICD-10-CM | POA: Diagnosis not present

## 2019-03-17 DIAGNOSIS — R7401 Elevation of levels of liver transaminase levels: Secondary | ICD-10-CM | POA: Diagnosis not present

## 2019-03-17 DIAGNOSIS — I700 Atherosclerosis of aorta: Secondary | ICD-10-CM | POA: Diagnosis not present

## 2019-03-17 DIAGNOSIS — R7301 Impaired fasting glucose: Secondary | ICD-10-CM | POA: Diagnosis not present

## 2019-03-17 DIAGNOSIS — Z0000 Encounter for general adult medical examination without abnormal findings: Secondary | ICD-10-CM | POA: Diagnosis not present

## 2019-03-17 DIAGNOSIS — R030 Elevated blood-pressure reading, without diagnosis of hypertension: Secondary | ICD-10-CM | POA: Diagnosis not present

## 2019-05-31 ENCOUNTER — Other Ambulatory Visit: Payer: Self-pay

## 2019-05-31 ENCOUNTER — Ambulatory Visit
Admission: RE | Admit: 2019-05-31 | Discharge: 2019-05-31 | Disposition: A | Discharge status: Home / Self Care | Payer: Medicare Other | Source: Ambulatory Visit | Attending: Acute Care | Admitting: Acute Care

## 2019-05-31 DIAGNOSIS — F17210 Nicotine dependence, cigarettes, uncomplicated: Secondary | ICD-10-CM | POA: Diagnosis not present

## 2019-05-31 DIAGNOSIS — Z122 Encounter for screening for malignant neoplasm of respiratory organs: Secondary | ICD-10-CM

## 2019-05-31 DIAGNOSIS — Z87891 Personal history of nicotine dependence: Secondary | ICD-10-CM

## 2019-06-01 NOTE — Progress Notes (Signed)
Please call patient and let them  know their  low dose Ct was read as a Lung RADS 2: nodules that are benign in appearance and behavior with a very low likelihood of becoming a clinically active cancer due to size or lack of growth. Recommendation per radiology is for a repeat LDCT in 12 months. .Please let them  know we will order and schedule their  annual screening scan for 05/2020. Please let them  know there was notation of CAD on their  scan.  Please remind the patient  that this is a non-gated exam therefore degree or severity of disease  cannot be determined. Please have them  follow up with their PCP regarding potential risk factor modification, dietary therapy or pharmacologic therapy if clinically indicated. Pt.  is  currently on statin therapy. Please place order for annual  screening scan for 05/2020 and fax results to PCP. Thanks so much.  Please let patient know there was notation of Hepatic steatosis. This  is a term that describes the build up of fat in the liver. It is normal to have small amounts of fat in your liver, but when the proportion of liver cells that contain fat exceeds more than 5% it is indicative of early stage fatty liver.Treatment often involves reducing risk factors through a diet and exercise plan. It is generally a benign condition, but in a small percentage of patients it does require follow up. Please have the patient follow up with PCP regarding potential risk factor modification, dietary therapy or pharmacologic therapy if clinically indicated.

## 2019-06-02 ENCOUNTER — Other Ambulatory Visit: Payer: Self-pay | Admitting: *Deleted

## 2019-06-02 DIAGNOSIS — Z87891 Personal history of nicotine dependence: Secondary | ICD-10-CM

## 2019-06-02 DIAGNOSIS — F17210 Nicotine dependence, cigarettes, uncomplicated: Secondary | ICD-10-CM

## 2019-06-28 DIAGNOSIS — Z23 Encounter for immunization: Secondary | ICD-10-CM | POA: Diagnosis not present

## 2019-06-28 DIAGNOSIS — L570 Actinic keratosis: Secondary | ICD-10-CM | POA: Diagnosis not present

## 2019-06-28 DIAGNOSIS — L821 Other seborrheic keratosis: Secondary | ICD-10-CM | POA: Diagnosis not present

## 2020-01-26 DIAGNOSIS — Z23 Encounter for immunization: Secondary | ICD-10-CM | POA: Diagnosis not present

## 2020-03-14 DIAGNOSIS — I2510 Atherosclerotic heart disease of native coronary artery without angina pectoris: Secondary | ICD-10-CM | POA: Diagnosis not present

## 2020-03-14 DIAGNOSIS — R7301 Impaired fasting glucose: Secondary | ICD-10-CM | POA: Diagnosis not present

## 2020-03-14 DIAGNOSIS — Z125 Encounter for screening for malignant neoplasm of prostate: Secondary | ICD-10-CM | POA: Diagnosis not present

## 2020-03-21 DIAGNOSIS — R7401 Elevation of levels of liver transaminase levels: Secondary | ICD-10-CM | POA: Diagnosis not present

## 2020-03-21 DIAGNOSIS — R030 Elevated blood-pressure reading, without diagnosis of hypertension: Secondary | ICD-10-CM | POA: Diagnosis not present

## 2020-03-21 DIAGNOSIS — R7301 Impaired fasting glucose: Secondary | ICD-10-CM | POA: Diagnosis not present

## 2020-03-21 DIAGNOSIS — I2510 Atherosclerotic heart disease of native coronary artery without angina pectoris: Secondary | ICD-10-CM | POA: Diagnosis not present

## 2020-03-21 DIAGNOSIS — Z1339 Encounter for screening examination for other mental health and behavioral disorders: Secondary | ICD-10-CM | POA: Diagnosis not present

## 2020-03-21 DIAGNOSIS — G4700 Insomnia, unspecified: Secondary | ICD-10-CM | POA: Diagnosis not present

## 2020-03-21 DIAGNOSIS — J439 Emphysema, unspecified: Secondary | ICD-10-CM | POA: Diagnosis not present

## 2020-03-21 DIAGNOSIS — Z0000 Encounter for general adult medical examination without abnormal findings: Secondary | ICD-10-CM | POA: Diagnosis not present

## 2020-03-21 DIAGNOSIS — I700 Atherosclerosis of aorta: Secondary | ICD-10-CM | POA: Diagnosis not present

## 2020-03-21 DIAGNOSIS — K7030 Alcoholic cirrhosis of liver without ascites: Secondary | ICD-10-CM | POA: Diagnosis not present

## 2020-03-21 DIAGNOSIS — Z1331 Encounter for screening for depression: Secondary | ICD-10-CM | POA: Diagnosis not present

## 2020-03-21 DIAGNOSIS — I8500 Esophageal varices without bleeding: Secondary | ICD-10-CM | POA: Diagnosis not present

## 2020-03-21 DIAGNOSIS — F419 Anxiety disorder, unspecified: Secondary | ICD-10-CM | POA: Diagnosis not present

## 2020-03-21 DIAGNOSIS — F17200 Nicotine dependence, unspecified, uncomplicated: Secondary | ICD-10-CM | POA: Diagnosis not present

## 2020-03-22 DIAGNOSIS — Z23 Encounter for immunization: Secondary | ICD-10-CM | POA: Diagnosis not present

## 2020-05-31 ENCOUNTER — Inpatient Hospital Stay: Admission: RE | Admit: 2020-05-31 | Payer: Medicare Other | Source: Ambulatory Visit

## 2020-06-14 ENCOUNTER — Ambulatory Visit
Admission: RE | Admit: 2020-06-14 | Discharge: 2020-06-14 | Disposition: A | Discharge status: Home / Self Care | Payer: Medicare Other | Source: Ambulatory Visit | Attending: Acute Care | Admitting: Acute Care

## 2020-06-14 DIAGNOSIS — K8020 Calculus of gallbladder without cholecystitis without obstruction: Secondary | ICD-10-CM | POA: Diagnosis not present

## 2020-06-14 DIAGNOSIS — J984 Other disorders of lung: Secondary | ICD-10-CM | POA: Diagnosis not present

## 2020-06-14 DIAGNOSIS — Z87891 Personal history of nicotine dependence: Secondary | ICD-10-CM

## 2020-06-14 DIAGNOSIS — F17210 Nicotine dependence, cigarettes, uncomplicated: Secondary | ICD-10-CM

## 2020-06-14 DIAGNOSIS — J439 Emphysema, unspecified: Secondary | ICD-10-CM | POA: Diagnosis not present

## 2020-06-19 NOTE — Progress Notes (Signed)
Please call patient and let them  know their  low dose Ct was read as a Lung RADS 2: nodules that are benign in appearance and behavior with a very low likelihood of becoming a clinically active cancer due to size or lack of growth. Recommendation per radiology is for a repeat LDCT in 12 months. .Please let them  know we will order and schedule their  annual screening scan for 06/2021. Please let them  know there was notation of CAD on their  scan.  Please remind the patient  that this is a non-gated exam therefore degree or severity of disease  cannot be determined. Please have them  follow up with their PCP regarding potential risk factor modification, dietary therapy or pharmacologic therapy if clinically indicated. Pt.  is  currently on statin therapy. Please place order for annual  screening scan for  06/2021 and fax results to PCP. Thanks so much.  There was notation of Hepatic steatosis. This  is a term that describes the build up of fat in the liver. It is normal to have small amounts of fat in your liver, but when the proportion of liver cells that contain fat exceeds more than 5% it is indicative of early stage fatty liver.Treatment often involves reducing risk factors through a diet and exercise plan. It is generally a benign condition, but in a small percentage of patients it does require follow up. Please have the patient follow up with PCP regarding potential risk factor modification, dietary therapy or pharmacologic therapy if clinically indicated.

## 2020-06-21 ENCOUNTER — Other Ambulatory Visit: Payer: Self-pay | Admitting: *Deleted

## 2020-06-21 DIAGNOSIS — F17210 Nicotine dependence, cigarettes, uncomplicated: Secondary | ICD-10-CM

## 2020-06-21 DIAGNOSIS — Z87891 Personal history of nicotine dependence: Secondary | ICD-10-CM

## 2021-02-26 DIAGNOSIS — H2513 Age-related nuclear cataract, bilateral: Secondary | ICD-10-CM | POA: Diagnosis not present

## 2021-02-26 DIAGNOSIS — H40033 Anatomical narrow angle, bilateral: Secondary | ICD-10-CM | POA: Diagnosis not present

## 2021-03-04 DIAGNOSIS — H2511 Age-related nuclear cataract, right eye: Secondary | ICD-10-CM | POA: Diagnosis not present

## 2021-03-04 DIAGNOSIS — H2513 Age-related nuclear cataract, bilateral: Secondary | ICD-10-CM | POA: Diagnosis not present

## 2021-03-08 DIAGNOSIS — H2513 Age-related nuclear cataract, bilateral: Secondary | ICD-10-CM | POA: Diagnosis not present

## 2021-03-08 DIAGNOSIS — H2511 Age-related nuclear cataract, right eye: Secondary | ICD-10-CM | POA: Diagnosis not present

## 2021-04-18 DIAGNOSIS — Z961 Presence of intraocular lens: Secondary | ICD-10-CM | POA: Diagnosis not present

## 2021-04-18 DIAGNOSIS — H2513 Age-related nuclear cataract, bilateral: Secondary | ICD-10-CM | POA: Diagnosis not present

## 2021-04-18 DIAGNOSIS — H2511 Age-related nuclear cataract, right eye: Secondary | ICD-10-CM | POA: Diagnosis not present

## 2021-04-19 DIAGNOSIS — H2512 Age-related nuclear cataract, left eye: Secondary | ICD-10-CM | POA: Diagnosis not present

## 2021-05-09 DIAGNOSIS — H2512 Age-related nuclear cataract, left eye: Secondary | ICD-10-CM | POA: Diagnosis not present

## 2021-05-09 DIAGNOSIS — H25012 Cortical age-related cataract, left eye: Secondary | ICD-10-CM | POA: Diagnosis not present

## 2021-05-09 DIAGNOSIS — H25042 Posterior subcapsular polar age-related cataract, left eye: Secondary | ICD-10-CM | POA: Diagnosis not present

## 2021-05-09 DIAGNOSIS — Z961 Presence of intraocular lens: Secondary | ICD-10-CM | POA: Diagnosis not present

## 2021-05-09 DIAGNOSIS — H2513 Age-related nuclear cataract, bilateral: Secondary | ICD-10-CM | POA: Diagnosis not present

## 2021-05-22 DIAGNOSIS — R7301 Impaired fasting glucose: Secondary | ICD-10-CM | POA: Diagnosis not present

## 2021-05-22 DIAGNOSIS — R030 Elevated blood-pressure reading, without diagnosis of hypertension: Secondary | ICD-10-CM | POA: Diagnosis not present

## 2021-05-22 DIAGNOSIS — E7800 Pure hypercholesterolemia, unspecified: Secondary | ICD-10-CM | POA: Diagnosis not present

## 2021-05-22 DIAGNOSIS — Z125 Encounter for screening for malignant neoplasm of prostate: Secondary | ICD-10-CM | POA: Diagnosis not present

## 2021-05-29 DIAGNOSIS — Z0000 Encounter for general adult medical examination without abnormal findings: Secondary | ICD-10-CM | POA: Diagnosis not present

## 2021-05-29 DIAGNOSIS — I2510 Atherosclerotic heart disease of native coronary artery without angina pectoris: Secondary | ICD-10-CM | POA: Diagnosis not present

## 2021-05-29 DIAGNOSIS — R030 Elevated blood-pressure reading, without diagnosis of hypertension: Secondary | ICD-10-CM | POA: Diagnosis not present

## 2021-05-29 DIAGNOSIS — R7301 Impaired fasting glucose: Secondary | ICD-10-CM | POA: Diagnosis not present

## 2021-05-29 DIAGNOSIS — K7030 Alcoholic cirrhosis of liver without ascites: Secondary | ICD-10-CM | POA: Diagnosis not present

## 2021-05-29 DIAGNOSIS — F17200 Nicotine dependence, unspecified, uncomplicated: Secondary | ICD-10-CM | POA: Diagnosis not present

## 2021-05-29 DIAGNOSIS — R7401 Elevation of levels of liver transaminase levels: Secondary | ICD-10-CM | POA: Diagnosis not present

## 2021-05-29 DIAGNOSIS — J439 Emphysema, unspecified: Secondary | ICD-10-CM | POA: Diagnosis not present

## 2021-05-29 DIAGNOSIS — I8500 Esophageal varices without bleeding: Secondary | ICD-10-CM | POA: Diagnosis not present

## 2021-05-29 DIAGNOSIS — Z1339 Encounter for screening examination for other mental health and behavioral disorders: Secondary | ICD-10-CM | POA: Diagnosis not present

## 2021-05-29 DIAGNOSIS — I700 Atherosclerosis of aorta: Secondary | ICD-10-CM | POA: Diagnosis not present

## 2021-05-29 DIAGNOSIS — Z1331 Encounter for screening for depression: Secondary | ICD-10-CM | POA: Diagnosis not present

## 2021-05-29 DIAGNOSIS — E7800 Pure hypercholesterolemia, unspecified: Secondary | ICD-10-CM | POA: Diagnosis not present

## 2021-06-03 ENCOUNTER — Other Ambulatory Visit: Payer: Self-pay | Admitting: Internal Medicine

## 2021-06-03 DIAGNOSIS — F17200 Nicotine dependence, unspecified, uncomplicated: Secondary | ICD-10-CM

## 2021-07-18 ENCOUNTER — Encounter: Payer: Self-pay | Admitting: Gastroenterology

## 2021-07-19 ENCOUNTER — Other Ambulatory Visit: Payer: Self-pay | Admitting: *Deleted

## 2021-07-19 DIAGNOSIS — Z87891 Personal history of nicotine dependence: Secondary | ICD-10-CM

## 2021-07-19 DIAGNOSIS — F17210 Nicotine dependence, cigarettes, uncomplicated: Secondary | ICD-10-CM

## 2021-08-05 ENCOUNTER — Ambulatory Visit
Admission: RE | Admit: 2021-08-05 | Discharge: 2021-08-05 | Disposition: A | Discharge status: Home / Self Care | Payer: Medicare Other | Source: Ambulatory Visit | Attending: Acute Care | Admitting: Acute Care

## 2021-08-05 DIAGNOSIS — F17210 Nicotine dependence, cigarettes, uncomplicated: Secondary | ICD-10-CM | POA: Diagnosis not present

## 2021-08-05 DIAGNOSIS — Z87891 Personal history of nicotine dependence: Secondary | ICD-10-CM

## 2021-08-07 ENCOUNTER — Other Ambulatory Visit: Payer: Self-pay

## 2021-08-07 DIAGNOSIS — Z87891 Personal history of nicotine dependence: Secondary | ICD-10-CM

## 2021-08-07 DIAGNOSIS — Z122 Encounter for screening for malignant neoplasm of respiratory organs: Secondary | ICD-10-CM

## 2021-08-07 DIAGNOSIS — F17210 Nicotine dependence, cigarettes, uncomplicated: Secondary | ICD-10-CM

## 2021-08-21 ENCOUNTER — Encounter: Payer: Self-pay | Admitting: Gastroenterology

## 2021-08-21 ENCOUNTER — Ambulatory Visit (INDEPENDENT_AMBULATORY_CARE_PROVIDER_SITE_OTHER): Payer: Medicare Other | Admitting: Gastroenterology

## 2021-08-21 VITALS — BP 120/80 | HR 85 | Ht 72.0 in | Wt 155.0 lb

## 2021-08-21 DIAGNOSIS — Z86010 Personal history of colonic polyps: Secondary | ICD-10-CM | POA: Diagnosis not present

## 2021-08-21 DIAGNOSIS — R748 Abnormal levels of other serum enzymes: Secondary | ICD-10-CM

## 2021-08-21 NOTE — Progress Notes (Addendum)
? ? ?History of Present Illness: This is a 76 year old male referred by [name]., MD for the evaluation of a personal history of adenomatous colon polyps and elevated LFTs.  He has a history of alcoholic hepatitis with portal hypertension and ascites that resolved in 2013.  Initially in 2013 the diagnosis of cirrhosis was entertained however subsequent imaging studies including a CT and MRI in 03/2013 showed no evidence of cirrhosis. On chest CT on August 06, 2021 with views of the upper abdomen no hepatic abnormalities were mentioned.  Cholelithiasis was noted along with a 1.9 cm left renal cyst.  LFTs were elevated in January 2023: AST=196, ALT=86, others normal.  CBC in January 2023 was normal with platelet=192. A1c normal, TSH normal, lipid panel normal in January 2023. EGD performed in 2013 did not show evidence of portal hypertension and there were no esophageal or gastric varices.  He states he drinks about 2 alcoholic beverages most days. He has no ongoing gastrointestinal complaints. Denies weight loss, abdominal pain, constipation, diarrhea, change in stool caliber, melena, hematochezia, nausea, vomiting, dysphagia, reflux symptoms, chest pain. ? ? ? ?No Known Allergies ?Outpatient Medications Prior to Visit  ?Medication Sig Dispense Refill  ? ALPRAZolam (XANAX) 0.5 MG tablet Take 1 tablet by mouth as needed.    ? ketorolac (ACULAR) 0.5 % ophthalmic solution 1 drop 4 (four) times daily as needed.    ? methocarbamol (ROBAXIN) 500 MG tablet Take 1 tablet by mouth as needed.    ? rosuvastatin (CRESTOR) 10 MG tablet Take 10 mg by mouth at bedtime.    ? zolpidem (AMBIEN) 10 MG tablet Take 1 tablet by mouth at bedtime as needed.    ? ?No facility-administered medications prior to visit.  ? ?Past Medical History:  ?Diagnosis Date  ? Alcoholic cirrhosis of liver with ascites ([place])   ? Alcoholism ([place])   ? Allergy   ? spring occasionally  ? Anxiety disorder   ? Diverticulosis   ? Elevated liver function  tests   ? Impaired fasting glucose   ? Internal hemorrhoids   ? Tubular adenoma of colon 11/2009  ? White coat hypertension   ? ?Past Surgical History:  ?Procedure Laterality Date  ? COLONOSCOPY  11/2009  ? 2011 - 3 adenomas, diverticulosis, hemorhoids  ? ESOPHAGOGASTRODUODENOSCOPY  05/26/2011  ? Procedure: ESOPHAGOGASTRODUODENOSCOPY (EGD);  Surgeon: Scarlette Shorts, MD;  Location: [facility] ENDOSCOPY;  Service: Endoscopy;  Laterality: N/A;  ? HERNIA REPAIR    ? POLYPECTOMY    ? UPPER GASTROINTESTINAL ENDOSCOPY    ? WRIST FRACTURE SURGERY Right   ? 2008 or 09  ? ?Social History  ? ?Socioeconomic History  ? Marital status: Married  ?  Spouse name: Not on file  ? Number of children: 2  ? Years of education: Masters  ? Highest education level: Not on file  ?Occupational History  ? Occupation: Retired   ?  Comment: Administrator, arts  ?Tobacco Use  ? Smoking status: Some Days  ?  Packs/day: 1.00  ?  Years: 48.00  ?  Pack years: 48.00  ?  Types: Cigarettes  ? Smokeless tobacco: Never  ? Tobacco comments:  ?  5 cigarettes a day   ?Substance and Sexual Activity  ? Alcohol use: Yes  ?  Alcohol/week: 17.0 - 24.0 standard drinks  ?  Types: 3 Glasses of wine, 14 - 21 Standard drinks or equivalent per week  ?  Comment: once a week  ? Drug  use: No  ? Sexual activity: Not Currently  ?Other Topics Concern  ? Not on file  ?Social History Narrative  ? married- wife teaches in [place] "civil servant"   ? 2 daughters, 2 GC   ? Masters in [employer]   ? 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 smokes 1/2 ppd X 40 years; 2-3 drinks per day (ETOH abuse off and on); No drugs  ? ?Social Determinants of Health  ? ?Financial Resource Strain: Not on file  ?Food Insecurity: Not on file  ?Transportation Needs: Not on file  ?Physical Activity: Not on file  ?Stress: Not on file  ?Social Connections: Not on file  ? ?Family History  ?Problem Relation Age of Onset  ? Lymphoma Father   ?     died 28  ? Melanoma Daughter   ?      twice  ? Colon cancer Neg Hx   ? Colon polyps Neg Hx   ? Rectal cancer Neg Hx   ? Stomach cancer Neg Hx   ? ?   ?Review of Systems: Pertinent positive and negative review of systems were noted in the above HPI section. All other review of systems were otherwise negative. ? ? ?Physical Exam: ?General: Well developed, well nourished, no acute distress ?Head: Normocephalic and atraumatic ?Eyes: Sclerae anicteric, EOMI ?Ears: Normal auditory acuity ?Mouth: No abnormalities ?Neck: Supple, no masses or thyromegaly ?Lungs: Clear throughout to auscultation ?Heart: Regular rate and rhythm; no murmurs, rubs or bruits ?Abdomen: Soft, non tender and non distended. No masses, hepatosplenomegaly or hernias noted. Normal Bowel sounds ?Rectal: Deferred to colonoscopy  ?Musculoskeletal: Symmetrical with no gross deformities  ?Skin: No lesions on visible extremities ?Pulses:  Normal pulses noted ?Extremities: No clubbing, cyanosis, edema or deformities noted ?Neurological: Alert oriented x 4, grossly nonfocal ?Cervical Nodes:  No significant cervical adenopathy ?Inguinal Nodes: No significant inguinal adenopathy ?Psychological:  Alert and cooperative. Normal mood and affect ? ? ?Assessment and Recommendations: ? ?Personal history of adenomatous colon polyps.  He is due for surveillance colonoscopy and would like to proceed.  Schedule colonoscopy. The risks (including bleeding, perforation, infection, missed lesions, medication reactions and possible hospitalization or surgery if complications occur), benefits, and alternatives to colonoscopy with possible biopsy and possible polypectomy were discussed with the patient and they consent to proceed.    ?History of alcoholic hepatitis with portal hypertension and ascites in 2013 that resolved in 2014.  Chest CT in April 2023 with views of the upper abdomen did not note any hepatic abnormalities.  Cholelithiasis was noted.  Schedule RUQ [place] to further evaluate. EGD in 2013 showed no  evidence of portal hypertension - no portal gastropathy, esophageal varices or gastric varices.   ?Elevated transaminases AST > ALT. Repeat LFTs and check PT/INR. If LFTs remain elevated consider alcohol, medications, autoimmune, viral and other etiologies. REV in 1 month.  ? ? ?cc: [name]., MD ?[address] ?[place],  [place] [address] ?

## 2021-08-21 NOTE — Patient Instructions (Signed)
You have been scheduled for an abdominal ultrasound at [facility] (1st floor of hospital) on 09/02/21 at 10:00am. Please arrive 15 minutes prior to your appointment for registration. Make certain not to have anything to eat or drink 6 hours prior to your appointment. Should you need to reschedule your appointment, please contact radiology at [phone]. This test typically takes about 30 minutes to perform. ? ?The [facility] providers would like to encourage you to use [facility] to communicate with providers for non-urgent requests or questions.  Due to long hold times on the telephone, sending your provider a message by [facility] may be a faster and more efficient way to get a response.  Please allow 48 business hours for a response.  Please remember that this is for non-urgent requests.  ? ?Due to recent changes in healthcare laws, you may see the results of your imaging and laboratory studies on MyChart before your provider has had a chance to review them.  We understand that in some cases there may be results that are confusing or concerning to you. Not all laboratory results come back in the same time frame and the provider may be waiting for multiple results in order to interpret others.  Please give [place] 48 hours in order for your provider to thoroughly review all the results before contacting the office for clarification of your results.  ? ?Thank you for choosing me and [facility]. ? ?Malcolm T. Dagoberto Ligas., MD., [facility] ? ?

## 2021-08-22 ENCOUNTER — Telehealth: Payer: Self-pay

## 2021-08-22 NOTE — Telephone Encounter (Signed)
-----   Message from Ladene Artist, MD sent at 08/21/2021  5:39 PM EDT ----- ?LFTs from Dr. Raul Del office received, AST and ALT were elevated. I made an addendum to my office note. No need for EGD at this point. Schedule colonoscopy. Send LFTs, PT/INR. If LFTs remain elevated we will send additional hepatic serologies.  ? ?

## 2021-08-22 NOTE — Telephone Encounter (Signed)
Explained to patient that Dr. Fuller Plan has reviewed labs from PCP and wants him to repeat lab work to check liver function tests. Patient states he will MyChart message me tomorrow to let me know which day next week he can come in for labs. Also, patient states he will call me back after his ultrasound to schedule colonoscopy.  ?

## 2021-09-02 ENCOUNTER — Ambulatory Visit (HOSPITAL_COMMUNITY)
Admission: RE | Admit: 2021-09-02 | Discharge: 2021-09-02 | Disposition: A | Discharge status: Home / Self Care | Payer: Medicare Other | Source: Ambulatory Visit | Attending: Gastroenterology | Admitting: Gastroenterology

## 2021-09-02 DIAGNOSIS — R7989 Other specified abnormal findings of blood chemistry: Secondary | ICD-10-CM | POA: Diagnosis not present

## 2021-09-02 DIAGNOSIS — E7800 Pure hypercholesterolemia, unspecified: Secondary | ICD-10-CM | POA: Diagnosis not present

## 2021-09-02 DIAGNOSIS — R748 Abnormal levels of other serum enzymes: Secondary | ICD-10-CM | POA: Insufficient documentation

## 2021-09-02 DIAGNOSIS — R945 Abnormal results of liver function studies: Secondary | ICD-10-CM | POA: Diagnosis not present

## 2021-09-02 DIAGNOSIS — K8020 Calculus of gallbladder without cholecystitis without obstruction: Secondary | ICD-10-CM | POA: Diagnosis not present

## 2021-09-02 DIAGNOSIS — R7401 Elevation of levels of liver transaminase levels: Secondary | ICD-10-CM | POA: Diagnosis not present

## 2021-09-03 NOTE — Telephone Encounter (Signed)
See ultrasound result note from 09/03/21. Patient states he wants to discuss colonoscopy with his PCP before scheduling. Asked patient if he can come for labs this week that Dr. Fuller Plan wanted repeated. Patient states he went yesterday to Dr. Raul Del office to have repeat liver tests. Informed patient that I will request his labs from his PCP and have Dr. Fuller Plan review.  ?

## 2021-09-03 NOTE — Telephone Encounter (Signed)
Patient is returning your call.  

## 2021-09-04 ENCOUNTER — Other Ambulatory Visit: Payer: Self-pay

## 2021-09-04 DIAGNOSIS — R748 Abnormal levels of other serum enzymes: Secondary | ICD-10-CM

## 2021-09-04 NOTE — Telephone Encounter (Signed)
[place] results reviewed with patient yesterday. LFTs from 5/1 have improved: ALT=27, AST=57.  ?Recommend repeat LFTs with Dr. Brigitte Pulse or our office in 1 month.  ?Please contact us if he wants to proceed with colonoscopy.  ?

## 2021-09-04 NOTE — Telephone Encounter (Signed)
Labs received from PCP and placed on Dr. Lynne Leader desk for review.  ?

## 2021-09-04 NOTE — Telephone Encounter (Signed)
Informed patient to have repeat LFT's on 10/07/21. Labs put in Epic. Also reminded patient to contact our office when he is ready to schedule the colonoscopy. Patient verbalized understanding. ?

## 2021-12-05 DIAGNOSIS — E7800 Pure hypercholesterolemia, unspecified: Secondary | ICD-10-CM | POA: Diagnosis not present

## 2021-12-05 DIAGNOSIS — I700 Atherosclerosis of aorta: Secondary | ICD-10-CM | POA: Diagnosis not present

## 2021-12-05 DIAGNOSIS — J439 Emphysema, unspecified: Secondary | ICD-10-CM | POA: Diagnosis not present

## 2021-12-05 DIAGNOSIS — K7030 Alcoholic cirrhosis of liver without ascites: Secondary | ICD-10-CM | POA: Diagnosis not present

## 2021-12-05 DIAGNOSIS — R030 Elevated blood-pressure reading, without diagnosis of hypertension: Secondary | ICD-10-CM | POA: Diagnosis not present

## 2021-12-05 DIAGNOSIS — R7301 Impaired fasting glucose: Secondary | ICD-10-CM | POA: Diagnosis not present

## 2021-12-05 DIAGNOSIS — I2510 Atherosclerotic heart disease of native coronary artery without angina pectoris: Secondary | ICD-10-CM | POA: Diagnosis not present

## 2021-12-05 DIAGNOSIS — G250 Essential tremor: Secondary | ICD-10-CM | POA: Diagnosis not present

## 2021-12-05 DIAGNOSIS — G4700 Insomnia, unspecified: Secondary | ICD-10-CM | POA: Diagnosis not present

## 2021-12-05 DIAGNOSIS — F419 Anxiety disorder, unspecified: Secondary | ICD-10-CM | POA: Diagnosis not present

## 2021-12-05 DIAGNOSIS — I8500 Esophageal varices without bleeding: Secondary | ICD-10-CM | POA: Diagnosis not present

## 2021-12-05 DIAGNOSIS — F17200 Nicotine dependence, unspecified, uncomplicated: Secondary | ICD-10-CM | POA: Diagnosis not present

## 2022-02-03 DIAGNOSIS — Z23 Encounter for immunization: Secondary | ICD-10-CM | POA: Diagnosis not present

## 2022-06-11 DIAGNOSIS — R7301 Impaired fasting glucose: Secondary | ICD-10-CM | POA: Diagnosis not present

## 2022-06-11 DIAGNOSIS — Z125 Encounter for screening for malignant neoplasm of prostate: Secondary | ICD-10-CM | POA: Diagnosis not present

## 2022-06-11 DIAGNOSIS — I2510 Atherosclerotic heart disease of native coronary artery without angina pectoris: Secondary | ICD-10-CM | POA: Diagnosis not present

## 2022-06-11 DIAGNOSIS — R030 Elevated blood-pressure reading, without diagnosis of hypertension: Secondary | ICD-10-CM | POA: Diagnosis not present

## 2022-06-11 DIAGNOSIS — R7989 Other specified abnormal findings of blood chemistry: Secondary | ICD-10-CM | POA: Diagnosis not present

## 2022-06-11 DIAGNOSIS — E7800 Pure hypercholesterolemia, unspecified: Secondary | ICD-10-CM | POA: Diagnosis not present

## 2022-06-18 DIAGNOSIS — Z1339 Encounter for screening examination for other mental health and behavioral disorders: Secondary | ICD-10-CM | POA: Diagnosis not present

## 2022-06-18 DIAGNOSIS — I700 Atherosclerosis of aorta: Secondary | ICD-10-CM | POA: Diagnosis not present

## 2022-06-18 DIAGNOSIS — Z23 Encounter for immunization: Secondary | ICD-10-CM | POA: Diagnosis not present

## 2022-06-18 DIAGNOSIS — F419 Anxiety disorder, unspecified: Secondary | ICD-10-CM | POA: Diagnosis not present

## 2022-06-18 DIAGNOSIS — R7301 Impaired fasting glucose: Secondary | ICD-10-CM | POA: Diagnosis not present

## 2022-06-18 DIAGNOSIS — Z0000 Encounter for general adult medical examination without abnormal findings: Secondary | ICD-10-CM | POA: Diagnosis not present

## 2022-06-18 DIAGNOSIS — Z1331 Encounter for screening for depression: Secondary | ICD-10-CM | POA: Diagnosis not present

## 2022-06-18 DIAGNOSIS — G4700 Insomnia, unspecified: Secondary | ICD-10-CM | POA: Diagnosis not present

## 2022-06-18 DIAGNOSIS — E7800 Pure hypercholesterolemia, unspecified: Secondary | ICD-10-CM | POA: Diagnosis not present

## 2022-06-18 DIAGNOSIS — I2510 Atherosclerotic heart disease of native coronary artery without angina pectoris: Secondary | ICD-10-CM | POA: Diagnosis not present

## 2022-06-18 DIAGNOSIS — K7010 Alcoholic hepatitis without ascites: Secondary | ICD-10-CM | POA: Diagnosis not present

## 2022-06-18 DIAGNOSIS — J439 Emphysema, unspecified: Secondary | ICD-10-CM | POA: Diagnosis not present

## 2022-06-18 DIAGNOSIS — R7401 Elevation of levels of liver transaminase levels: Secondary | ICD-10-CM | POA: Diagnosis not present

## 2022-06-18 DIAGNOSIS — R030 Elevated blood-pressure reading, without diagnosis of hypertension: Secondary | ICD-10-CM | POA: Diagnosis not present

## 2022-06-18 DIAGNOSIS — F17200 Nicotine dependence, unspecified, uncomplicated: Secondary | ICD-10-CM | POA: Diagnosis not present

## 2022-06-18 DIAGNOSIS — I8500 Esophageal varices without bleeding: Secondary | ICD-10-CM | POA: Diagnosis not present

## 2022-08-07 ENCOUNTER — Other Ambulatory Visit: Payer: Medicare Other

## 2022-08-08 ENCOUNTER — Ambulatory Visit
Admission: RE | Admit: 2022-08-08 | Discharge: 2022-08-08 | Disposition: A | Discharge status: Home / Self Care | Payer: Medicare Other | Source: Ambulatory Visit | Attending: Acute Care | Admitting: Acute Care

## 2022-08-08 DIAGNOSIS — J432 Centrilobular emphysema: Secondary | ICD-10-CM | POA: Diagnosis not present

## 2022-08-08 DIAGNOSIS — Z87891 Personal history of nicotine dependence: Secondary | ICD-10-CM

## 2022-08-08 DIAGNOSIS — I771 Stricture of artery: Secondary | ICD-10-CM | POA: Diagnosis not present

## 2022-08-08 DIAGNOSIS — Z122 Encounter for screening for malignant neoplasm of respiratory organs: Secondary | ICD-10-CM

## 2022-08-08 DIAGNOSIS — I2510 Atherosclerotic heart disease of native coronary artery without angina pectoris: Secondary | ICD-10-CM | POA: Diagnosis not present

## 2022-08-08 DIAGNOSIS — F17210 Nicotine dependence, cigarettes, uncomplicated: Secondary | ICD-10-CM

## 2023-06-29 DIAGNOSIS — R5383 Other fatigue: Secondary | ICD-10-CM | POA: Diagnosis not present

## 2023-06-29 DIAGNOSIS — I2510 Atherosclerotic heart disease of native coronary artery without angina pectoris: Secondary | ICD-10-CM | POA: Diagnosis not present

## 2023-06-29 DIAGNOSIS — Z125 Encounter for screening for malignant neoplasm of prostate: Secondary | ICD-10-CM | POA: Diagnosis not present

## 2023-06-29 DIAGNOSIS — Z0189 Encounter for other specified special examinations: Secondary | ICD-10-CM | POA: Diagnosis not present

## 2023-06-29 DIAGNOSIS — Z0000 Encounter for general adult medical examination without abnormal findings: Secondary | ICD-10-CM | POA: Diagnosis not present

## 2023-06-29 DIAGNOSIS — E7800 Pure hypercholesterolemia, unspecified: Secondary | ICD-10-CM | POA: Diagnosis not present

## 2023-07-02 DIAGNOSIS — R7301 Impaired fasting glucose: Secondary | ICD-10-CM | POA: Diagnosis not present

## 2023-07-02 DIAGNOSIS — I8500 Esophageal varices without bleeding: Secondary | ICD-10-CM | POA: Diagnosis not present

## 2023-07-02 DIAGNOSIS — R5382 Chronic fatigue, unspecified: Secondary | ICD-10-CM | POA: Diagnosis not present

## 2023-07-02 DIAGNOSIS — I700 Atherosclerosis of aorta: Secondary | ICD-10-CM | POA: Diagnosis not present

## 2023-07-02 DIAGNOSIS — F17200 Nicotine dependence, unspecified, uncomplicated: Secondary | ICD-10-CM | POA: Diagnosis not present

## 2023-07-02 DIAGNOSIS — R7401 Elevation of levels of liver transaminase levels: Secondary | ICD-10-CM | POA: Diagnosis not present

## 2023-07-02 DIAGNOSIS — Z1339 Encounter for screening examination for other mental health and behavioral disorders: Secondary | ICD-10-CM | POA: Diagnosis not present

## 2023-07-02 DIAGNOSIS — Z1331 Encounter for screening for depression: Secondary | ICD-10-CM | POA: Diagnosis not present

## 2023-07-02 DIAGNOSIS — J439 Emphysema, unspecified: Secondary | ICD-10-CM | POA: Diagnosis not present

## 2023-07-02 DIAGNOSIS — I2510 Atherosclerotic heart disease of native coronary artery without angina pectoris: Secondary | ICD-10-CM | POA: Diagnosis not present

## 2023-07-02 DIAGNOSIS — Z0000 Encounter for general adult medical examination without abnormal findings: Secondary | ICD-10-CM | POA: Diagnosis not present

## 2023-07-02 DIAGNOSIS — K7030 Alcoholic cirrhosis of liver without ascites: Secondary | ICD-10-CM | POA: Diagnosis not present

## 2023-07-09 ENCOUNTER — Other Ambulatory Visit: Payer: Self-pay | Admitting: Internal Medicine

## 2023-07-09 DIAGNOSIS — K7030 Alcoholic cirrhosis of liver without ascites: Secondary | ICD-10-CM

## 2023-07-16 ENCOUNTER — Ambulatory Visit
Admission: RE | Admit: 2023-07-16 | Discharge: 2023-07-16 | Disposition: A | Discharge status: Home / Self Care | Source: Ambulatory Visit | Attending: Internal Medicine | Admitting: Internal Medicine

## 2023-07-16 DIAGNOSIS — K7030 Alcoholic cirrhosis of liver without ascites: Secondary | ICD-10-CM

## 2023-09-15 ENCOUNTER — Inpatient Hospital Stay (HOSPITAL_COMMUNITY)
Admission: EM | Admit: 2023-09-15 | Discharge: 2023-09-17 | DRG: 083 | Disposition: A | Discharge status: Home Health | Attending: Internal Medicine | Admitting: Internal Medicine

## 2023-09-15 ENCOUNTER — Other Ambulatory Visit: Payer: Self-pay

## 2023-09-15 ENCOUNTER — Emergency Department (HOSPITAL_COMMUNITY)

## 2023-09-15 DIAGNOSIS — E871 Hypo-osmolality and hyponatremia: Secondary | ICD-10-CM | POA: Diagnosis present

## 2023-09-15 DIAGNOSIS — R296 Repeated falls: Secondary | ICD-10-CM | POA: Diagnosis present

## 2023-09-15 DIAGNOSIS — E876 Hypokalemia: Secondary | ICD-10-CM | POA: Diagnosis present

## 2023-09-15 DIAGNOSIS — I6203 Nontraumatic chronic subdural hemorrhage: Principal | ICD-10-CM | POA: Diagnosis present

## 2023-09-15 DIAGNOSIS — Z808 Family history of malignant neoplasm of other organs or systems: Secondary | ICD-10-CM

## 2023-09-15 DIAGNOSIS — F17210 Nicotine dependence, cigarettes, uncomplicated: Secondary | ICD-10-CM | POA: Diagnosis present

## 2023-09-15 DIAGNOSIS — Z8719 Personal history of other diseases of the digestive system: Secondary | ICD-10-CM

## 2023-09-15 DIAGNOSIS — R000 Tachycardia, unspecified: Secondary | ICD-10-CM | POA: Diagnosis not present

## 2023-09-15 DIAGNOSIS — Z860101 Personal history of adenomatous and serrated colon polyps: Secondary | ICD-10-CM

## 2023-09-15 DIAGNOSIS — F1020 Alcohol dependence, uncomplicated: Secondary | ICD-10-CM | POA: Diagnosis present

## 2023-09-15 DIAGNOSIS — S8011XA Contusion of right lower leg, initial encounter: Secondary | ICD-10-CM | POA: Diagnosis present

## 2023-09-15 DIAGNOSIS — Y92099 Unspecified place in other non-institutional residence as the place of occurrence of the external cause: Secondary | ICD-10-CM

## 2023-09-15 DIAGNOSIS — S065XAA Traumatic subdural hemorrhage with loss of consciousness status unknown, initial encounter: Secondary | ICD-10-CM | POA: Diagnosis not present

## 2023-09-15 DIAGNOSIS — J439 Emphysema, unspecified: Secondary | ICD-10-CM | POA: Diagnosis not present

## 2023-09-15 DIAGNOSIS — Z602 Problems related to living alone: Secondary | ICD-10-CM | POA: Diagnosis present

## 2023-09-15 DIAGNOSIS — K7030 Alcoholic cirrhosis of liver without ascites: Secondary | ICD-10-CM | POA: Diagnosis present

## 2023-09-15 DIAGNOSIS — F419 Anxiety disorder, unspecified: Secondary | ICD-10-CM | POA: Diagnosis present

## 2023-09-15 DIAGNOSIS — R9431 Abnormal electrocardiogram [ECG] [EKG]: Secondary | ICD-10-CM | POA: Diagnosis not present

## 2023-09-15 DIAGNOSIS — Z79899 Other long term (current) drug therapy: Secondary | ICD-10-CM

## 2023-09-15 DIAGNOSIS — R0689 Other abnormalities of breathing: Secondary | ICD-10-CM | POA: Diagnosis not present

## 2023-09-15 DIAGNOSIS — S066X0A Traumatic subarachnoid hemorrhage without loss of consciousness, initial encounter: Secondary | ICD-10-CM | POA: Diagnosis not present

## 2023-09-15 DIAGNOSIS — S40812A Abrasion of left upper arm, initial encounter: Secondary | ICD-10-CM | POA: Diagnosis present

## 2023-09-15 DIAGNOSIS — J432 Centrilobular emphysema: Secondary | ICD-10-CM | POA: Diagnosis not present

## 2023-09-15 DIAGNOSIS — Z807 Family history of other malignant neoplasms of lymphoid, hematopoietic and related tissues: Secondary | ICD-10-CM

## 2023-09-15 DIAGNOSIS — W19XXXA Unspecified fall, initial encounter: Secondary | ICD-10-CM | POA: Diagnosis not present

## 2023-09-15 DIAGNOSIS — S41112A Laceration without foreign body of left upper arm, initial encounter: Secondary | ICD-10-CM | POA: Diagnosis present

## 2023-09-15 DIAGNOSIS — S7012XA Contusion of left thigh, initial encounter: Secondary | ICD-10-CM | POA: Diagnosis present

## 2023-09-15 DIAGNOSIS — E785 Hyperlipidemia, unspecified: Secondary | ICD-10-CM | POA: Diagnosis present

## 2023-09-15 DIAGNOSIS — N390 Urinary tract infection, site not specified: Secondary | ICD-10-CM | POA: Diagnosis present

## 2023-09-15 DIAGNOSIS — R930 Abnormal findings on diagnostic imaging of skull and head, not elsewhere classified: Secondary | ICD-10-CM | POA: Diagnosis not present

## 2023-09-15 DIAGNOSIS — E860 Dehydration: Secondary | ICD-10-CM | POA: Diagnosis present

## 2023-09-15 DIAGNOSIS — S81812A Laceration without foreign body, left lower leg, initial encounter: Secondary | ICD-10-CM | POA: Diagnosis present

## 2023-09-15 DIAGNOSIS — I10 Essential (primary) hypertension: Secondary | ICD-10-CM | POA: Diagnosis not present

## 2023-09-15 DIAGNOSIS — S066XAA Traumatic subarachnoid hemorrhage with loss of consciousness status unknown, initial encounter: Secondary | ICD-10-CM | POA: Diagnosis present

## 2023-09-15 DIAGNOSIS — S41111A Laceration without foreign body of right upper arm, initial encounter: Secondary | ICD-10-CM | POA: Diagnosis present

## 2023-09-15 DIAGNOSIS — S065X0A Traumatic subdural hemorrhage without loss of consciousness, initial encounter: Secondary | ICD-10-CM | POA: Diagnosis not present

## 2023-09-15 DIAGNOSIS — M47812 Spondylosis without myelopathy or radiculopathy, cervical region: Secondary | ICD-10-CM | POA: Diagnosis not present

## 2023-09-15 DIAGNOSIS — Z888 Allergy status to other drugs, medicaments and biological substances status: Secondary | ICD-10-CM

## 2023-09-15 DIAGNOSIS — R9082 White matter disease, unspecified: Secondary | ICD-10-CM | POA: Diagnosis not present

## 2023-09-15 DIAGNOSIS — R739 Hyperglycemia, unspecified: Secondary | ICD-10-CM | POA: Diagnosis not present

## 2023-09-15 DIAGNOSIS — Y900 Blood alcohol level of less than 20 mg/100 ml: Secondary | ICD-10-CM | POA: Diagnosis present

## 2023-09-15 DIAGNOSIS — E872 Acidosis, unspecified: Secondary | ICD-10-CM | POA: Diagnosis present

## 2023-09-15 DIAGNOSIS — Z043 Encounter for examination and observation following other accident: Secondary | ICD-10-CM | POA: Diagnosis not present

## 2023-09-15 DIAGNOSIS — S8012XA Contusion of left lower leg, initial encounter: Secondary | ICD-10-CM | POA: Diagnosis present

## 2023-09-15 DIAGNOSIS — S7011XA Contusion of right thigh, initial encounter: Secondary | ICD-10-CM | POA: Diagnosis present

## 2023-09-15 DIAGNOSIS — Z66 Do not resuscitate: Secondary | ICD-10-CM | POA: Diagnosis present

## 2023-09-15 DIAGNOSIS — Z9889 Other specified postprocedural states: Secondary | ICD-10-CM

## 2023-09-15 DIAGNOSIS — W010XXA Fall on same level from slipping, tripping and stumbling without subsequent striking against object, initial encounter: Secondary | ICD-10-CM | POA: Diagnosis present

## 2023-09-15 DIAGNOSIS — Z9181 History of falling: Secondary | ICD-10-CM

## 2023-09-15 DIAGNOSIS — M5030 Other cervical disc degeneration, unspecified cervical region: Secondary | ICD-10-CM | POA: Diagnosis not present

## 2023-09-15 DIAGNOSIS — E162 Hypoglycemia, unspecified: Secondary | ICD-10-CM | POA: Diagnosis present

## 2023-09-15 DIAGNOSIS — R140 Abdominal distension (gaseous): Secondary | ICD-10-CM | POA: Diagnosis not present

## 2023-09-15 LAB — CBC WITH DIFFERENTIAL/PLATELET
Abs Immature Granulocytes: 0.04 10*3/uL (ref 0.00–0.07)
Basophils Absolute: 0 10*3/uL (ref 0.0–0.1)
Basophils Relative: 0 %
Eosinophils Absolute: 0 10*3/uL (ref 0.0–0.5)
Eosinophils Relative: 0 %
HCT: 42.8 % (ref 39.0–52.0)
Hemoglobin: 14.5 g/dL (ref 13.0–17.0)
Immature Granulocytes: 1 %
Lymphocytes Relative: 11 %
Lymphs Abs: 0.7 10*3/uL (ref 0.7–4.0)
MCH: 34.6 pg — ABNORMAL HIGH (ref 26.0–34.0)
MCHC: 33.9 g/dL (ref 30.0–36.0)
MCV: 102.1 fL — ABNORMAL HIGH (ref 80.0–100.0)
Monocytes Absolute: 0.5 10*3/uL (ref 0.1–1.0)
Monocytes Relative: 8 %
Neutro Abs: 5.5 10*3/uL (ref 1.7–7.7)
Neutrophils Relative %: 80 %
Platelets: 169 10*3/uL (ref 150–400)
RBC: 4.19 MIL/uL — ABNORMAL LOW (ref 4.22–5.81)
RDW: 13 % (ref 11.5–15.5)
WBC: 6.9 10*3/uL (ref 4.0–10.5)
nRBC: 0 % (ref 0.0–0.2)

## 2023-09-15 LAB — BLOOD GAS, VENOUS
Acid-Base Excess: 10.6 mmol/L — ABNORMAL HIGH (ref 0.0–2.0)
Bicarbonate: 35.7 mmol/L — ABNORMAL HIGH (ref 20.0–28.0)
O2 Saturation: 47.5 %
Patient temperature: 37
pCO2, Ven: 48 mmHg (ref 44–60)
pH, Ven: 7.48 — ABNORMAL HIGH (ref 7.25–7.43)
pO2, Ven: <31 mmHg — CL (ref 32–45)

## 2023-09-15 LAB — URINALYSIS, W/ REFLEX TO CULTURE (INFECTION SUSPECTED)
Bacteria, UA: NONE SEEN
Bilirubin Urine: NEGATIVE
Glucose, UA: >=500 mg/dL — AB
Hgb urine dipstick: NEGATIVE
Ketones, ur: NEGATIVE mg/dL
Nitrite: NEGATIVE
Protein, ur: NEGATIVE mg/dL
Specific Gravity, Urine: 1.001 — ABNORMAL LOW (ref 1.005–1.030)
WBC, UA: >50 WBC/hpf (ref 0–5)
pH: 8 (ref 5.0–8.0)

## 2023-09-15 LAB — PROTIME-INR
INR: 1.1 (ref 0.8–1.2)
Prothrombin Time: 14.8 s (ref 11.4–15.2)

## 2023-09-15 LAB — COMPREHENSIVE METABOLIC PANEL WITH GFR
ALT: 33 U/L (ref 0–44)
AST: 68 U/L — ABNORMAL HIGH (ref 15–41)
Albumin: 3.2 g/dL — ABNORMAL LOW (ref 3.5–5.0)
Alkaline Phosphatase: 149 U/L — ABNORMAL HIGH (ref 38–126)
Anion gap: 8 (ref 5–15)
BUN: 5 mg/dL — ABNORMAL LOW (ref 8–23)
CO2: 29 mmol/L (ref 22–32)
Calcium: 8.9 mg/dL (ref 8.9–10.3)
Chloride: 97 mmol/L — ABNORMAL LOW (ref 98–111)
Creatinine, Ser: 0.36 mg/dL — ABNORMAL LOW (ref 0.61–1.24)
GFR, Estimated: >60 mL/min (ref >60)
Glucose, Bld: 146 mg/dL — ABNORMAL HIGH (ref 70–99)
Potassium: 3.2 mmol/L — ABNORMAL LOW (ref 3.5–5.1)
Sodium: 134 mmol/L — ABNORMAL LOW (ref 135–145)
Total Bilirubin: 0.9 mg/dL (ref 0.0–1.2)
Total Protein: 6.7 g/dL (ref 6.5–8.1)

## 2023-09-15 LAB — CBG MONITORING, ED
Glucose-Capillary: 157 mg/dL — ABNORMAL HIGH (ref 70–99)
Glucose-Capillary: 125 mg/dL — ABNORMAL HIGH (ref 70–99)

## 2023-09-15 LAB — C DIFFICILE QUICK SCREEN W PCR REFLEX
C Diff antigen: NEGATIVE
C Diff interpretation: NOT DETECTED
C Diff toxin: NEGATIVE

## 2023-09-15 LAB — AMMONIA: Ammonia: 18 umol/L (ref 9–35)

## 2023-09-15 LAB — ETHANOL: Alcohol, Ethyl (B): <15 mg/dL (ref <15)

## 2023-09-15 LAB — I-STAT CG4 LACTIC ACID, ED: Lactic Acid, Venous: 2 mmol/L (ref 0.5–1.9)

## 2023-09-15 MED ORDER — THIAMINE MONONITRATE 100 MG PO TABS
100.0000 mg | ORAL_TABLET | Freq: Every day | ORAL | Status: DC
  Administered 2023-09-15 – 2023-09-16 (×2): 100 mg via ORAL
  Filled 2023-09-15 (×2): qty 1

## 2023-09-15 MED ORDER — ADULT MULTIVITAMIN W/MINERALS CH
1.0000 | ORAL_TABLET | Freq: Every day | ORAL | Status: DC
Start: 2023-09-15 — End: 2023-09-17
  Administered 2023-09-15 – 2023-09-17 (×3): 1 via ORAL
  Filled 2023-09-15 (×3): qty 1

## 2023-09-15 MED ORDER — LORAZEPAM 2 MG/ML IJ SOLN
1.0000 mg | Freq: Once | INTRAMUSCULAR | Status: AC
Start: 2023-09-15 — End: 2023-09-15
  Administered 2023-09-15: 1 mg via INTRAVENOUS
  Filled 2023-09-15: qty 1

## 2023-09-15 MED ORDER — ROSUVASTATIN CALCIUM 20 MG PO TABS: 10.0000 mg | ORAL_TABLET | Freq: Every day | ORAL | Status: DC

## 2023-09-15 MED ORDER — ONDANSETRON HCL 4 MG PO TABS: 4.0000 mg | ORAL_TABLET | Freq: Four times a day (QID) | ORAL | Status: DC | PRN

## 2023-09-15 MED ORDER — LACTATED RINGERS IV SOLN: INTRAVENOUS | Status: AC

## 2023-09-15 MED ORDER — ONDANSETRON HCL 4 MG/2ML IJ SOLN
4.0000 mg | Freq: Four times a day (QID) | INTRAMUSCULAR | Status: DC | PRN
  Administered 2023-09-16: 4 mg via INTRAVENOUS
  Filled 2023-09-15: qty 2

## 2023-09-15 MED ORDER — VANCOMYCIN HCL IN DEXTROSE 1-5 GM/200ML-% IV SOLN
1000.0000 mg | Freq: Once | INTRAVENOUS | Status: AC
  Administered 2023-09-15: 1000 mg via INTRAVENOUS
  Filled 2023-09-15: qty 200

## 2023-09-15 MED ORDER — SODIUM CHLORIDE 0.9 % IV SOLN
1.0000 g | Freq: Every day | INTRAVENOUS | Status: DC
  Administered 2023-09-15 – 2023-09-17 (×3): 1 g via INTRAVENOUS
  Filled 2023-09-15 (×3): qty 10

## 2023-09-15 MED ORDER — FOLIC ACID 1 MG PO TABS
1.0000 mg | ORAL_TABLET | Freq: Every day | ORAL | Status: DC
  Administered 2023-09-15 – 2023-09-17 (×3): 1 mg via ORAL
  Filled 2023-09-15 (×3): qty 1

## 2023-09-15 MED ORDER — METRONIDAZOLE 500 MG/100ML IV SOLN
500.0000 mg | Freq: Once | INTRAVENOUS | Status: AC
  Administered 2023-09-15: 500 mg via INTRAVENOUS
  Filled 2023-09-15: qty 100

## 2023-09-15 MED ORDER — LORAZEPAM 1 MG PO TABS
1.0000 mg | ORAL_TABLET | ORAL | Status: DC | PRN
  Administered 2023-09-16 (×2): 2 mg via ORAL
  Filled 2023-09-15: qty 2
  Filled 2023-09-15: qty 3

## 2023-09-15 MED ORDER — TRAZODONE HCL 50 MG PO TABS
25.0000 mg | ORAL_TABLET | Freq: Every evening | ORAL | Status: DC | PRN
  Administered 2023-09-15 – 2023-09-16 (×2): 25 mg via ORAL
  Filled 2023-09-15 (×2): qty 1

## 2023-09-15 MED ORDER — IBUPROFEN 200 MG PO TABS
400.0000 mg | ORAL_TABLET | Freq: Four times a day (QID) | ORAL | Status: DC | PRN
  Administered 2023-09-15 – 2023-09-16 (×2): 400 mg via ORAL
  Filled 2023-09-15 (×2): qty 2

## 2023-09-15 MED ORDER — ALBUTEROL SULFATE (2.5 MG/3ML) 0.083% IN NEBU: 2.5000 mg | INHALATION_SOLUTION | RESPIRATORY_TRACT | Status: DC | PRN

## 2023-09-15 MED ORDER — SODIUM CHLORIDE 0.9 % IV SOLN
2.0000 g | Freq: Once | INTRAVENOUS | Status: AC
  Administered 2023-09-15: 2 g via INTRAVENOUS
  Filled 2023-09-15: qty 12.5

## 2023-09-15 MED ORDER — THIAMINE HCL 100 MG/ML IJ SOLN
100.0000 mg | Freq: Every day | INTRAMUSCULAR | Status: DC
Start: 2023-09-15 — End: 2023-09-17
  Administered 2023-09-17: 100 mg via INTRAVENOUS
  Filled 2023-09-15 (×2): qty 2

## 2023-09-15 MED ORDER — LACTATED RINGERS IV BOLUS (SEPSIS)
1000.0000 mL | Freq: Once | INTRAVENOUS | Status: AC
  Administered 2023-09-15: 1000 mL via INTRAVENOUS

## 2023-09-15 NOTE — H&P (Signed)
 History and Physical  [name] VHQ:[id number] DOB: 28-Jan-1946 DOA: 09/15/2023  PCP: Jeannine Milroy., MD   Chief Complaint: Broadus Canes up my arms  HPI: [name] is a 78 y.o. male with medical history significant for anxiety, alcoholism and alcoholic cirrhosis of the liver presents to the emergency department with bilateral arm skin tears after having a fall at home 2 days ago, found to have bilateral subdural hematomas which neurosurgery feels are chronic.  History is provided by this gentleman, who actually lives on his own and is a good historian.  He states he has 2 grown daughters who live on the other side of [place] from him, and check on him but he does his best to live independently.  He states that he fell at home about 4 months ago and hit his head at that time, but did not seek any medical care.  However 2 days ago, he fell and broke his fall with his arms, did not lose consciousness and he is confident that he did not hit his head.  He denies any chest pain, syncope, fevers, chills, dysuria, urgency or frequency of urination.  He states that he feels like he has been eating and drinking a normal amount.  Review of Systems: Please see HPI for pertinent positives and negatives. A complete 10 system review of systems are otherwise negative.  Past Medical History:  Diagnosis Date   Alcoholic cirrhosis of liver with ascites (HCC)    Alcoholism (HCC)    Allergy    spring occasionally   Anxiety disorder    Diverticulosis    Elevated liver function tests    Impaired fasting glucose    Internal hemorrhoids    Tubular adenoma of colon 11/2009   White coat hypertension    Past Surgical History:  Procedure Laterality Date   COLONOSCOPY  11/2009   2011 - 3 adenomas, diverticulosis, hemorhoids   ESOPHAGOGASTRODUODENOSCOPY  05/26/2011   Procedure: ESOPHAGOGASTRODUODENOSCOPY (EGD);  Surgeon: Legrand Puma, MD;  Location: [facility] ENDOSCOPY;  Service: Endoscopy;  Laterality: N/A;   HERNIA  REPAIR     POLYPECTOMY     UPPER GASTROINTESTINAL ENDOSCOPY     WRIST FRACTURE SURGERY Right    2008 or 09   Social History:  reports that he has been smoking cigarettes. He has a 48 pack-year smoking history. He has never used smokeless tobacco. He reports current alcohol  use of about 17.0 - 24.0 standard drinks of alcohol  per week. He reports that he does not use drugs.  No Known Allergies  Family History  Problem Relation Age of Onset   Lymphoma Father        died 86   Melanoma Daughter        twice   Colon cancer Neg Hx    Colon polyps Neg Hx    Rectal cancer Neg Hx    Stomach cancer Neg Hx      Prior to Admission medications   Medication Sig Start Date End Date Taking? Authorizing Provider  ALPRAZolam  (XANAX ) 0.5 MG tablet Take 1 tablet by mouth as needed. 02/21/15   [provider]  ketorolac (ACULAR) 0.5 % ophthalmic solution 1 drop 4 (four) times daily as needed.    [provider]  methocarbamol (ROBAXIN) 500 MG tablet Take 1 tablet by mouth as needed. 08/14/15   [provider]  rosuvastatin (CRESTOR) 10 MG tablet Take 10 mg by mouth at bedtime. 07/10/21   [provider]  zolpidem (AMBIEN) 10 MG tablet  Take 1 tablet by mouth at bedtime as needed. 09/03/20   [provider]    Physical Exam: BP (!) 163/126   Pulse (!) 111   Temp 98 F (36.7 C) (Oral)   Resp 18   SpO2 99%  General:  Alert, oriented, calm, thin elderly gentleman who appears his stated age.  He looks disheveled and has multiple excoriations in his bilateral upper and lower extremities in various stages of healing.  He has acute appearing skin tears in the bilateral upper arms.  None of his skin lesions look infected. Cardiovascular: RRR, no murmurs or rubs, no peripheral edema  Respiratory: clear to auscultation bilaterally, no wheezes, no crackles  Abdomen: soft, nontender, nondistended, normal bowel tones heard  Skin: dry, no rashes, skin tears as described  above Musculoskeletal: no joint effusions, normal range of motion  Psychiatric: appropriate affect, normal speech  Neurologic: extraocular muscles intact, clear speech, moving all extremities with intact sensorium         Labs on Admission:  Basic Metabolic Panel: Recent Labs  Lab 09/15/23 1114  NA 134*  K 3.2*  CL 97*  CO2 29  GLUCOSE 146*  BUN 5*  CREATININE 0.36*  CALCIUM 8.9   Liver Function Tests: Recent Labs  Lab 09/15/23 1114  AST 68*  ALT 33  ALKPHOS 149*  BILITOT 0.9  PROT 6.7  ALBUMIN  3.2*   No results for input(s): "LIPASE", "AMYLASE" in the last 168 hours. Recent Labs  Lab 09/15/23 1204  AMMONIA 18   CBC: Recent Labs  Lab 09/15/23 1114  WBC 6.9  NEUTROABS 5.5  HGB 14.5  HCT 42.8  MCV 102.1*  PLT 169   Cardiac Enzymes: No results for input(s): "CKTOTAL", "CKMB", "CKMBINDEX", "TROPONINI" in the last 168 hours. BNP (last 3 results) No results for input(s): "BNP" in the last 8760 hours.  ProBNP (last 3 results) No results for input(s): "PROBNP" in the last 8760 hours.  CBG: Recent Labs  Lab 09/15/23 1046  GLUCAP 157*    Radiological Exams on Admission: CT Head Wo Contrast Addendum Date: 09/15/2023 ADDENDUM REPORT: 09/15/2023 12:09 ADDENDUM: Study discussed by telephone with Dr. Diego Foy RAY on 09/15/2023 at 1151 hours. Electronically Signed   By: Marlise Simpers M.D.   On: 09/15/2023 12:09   Result Date: 09/15/2023 CLINICAL DATA:  78 year old male with recent fall. EXAM: CT HEAD WITHOUT CONTRAST TECHNIQUE: Contiguous axial images were obtained from the base of the skull through the vertex without intravenous contrast. RADIATION DOSE REDUCTION: This exam was performed according to the departmental dose-optimization program which includes automated exposure control, adjustment of the mA and/or kV according to patient size and/or use of iterative reconstruction technique. COMPARISON:  None Available. FINDINGS: Brain: Positive for bilateral low-density  subdural fluid collections (coronal image 45), 8-9 mm in thickness bilaterally. Mild mixed density bilaterally. Fairly symmetric mass effect on the cerebral hemispheres. There is minimal rightward midline shift. Basilar cisterns remain patent. Superimposed small volume right superior convexity frontal subarachnoid hemorrhage best seen on sagittal image 34 (greater than 2 sulci). No intraventricular blood. No ventriculomegaly. No cerebral contusion identified. No cerebral edema or cortically based infarct identified. Scattered, moderate bilateral cerebral white matter hypodensity. No cortical encephalomalacia identified. Vascular: No suspicious intracranial vascular hyperdensity. Skull: Intact.  No fracture identified. Sinuses/Orbits: Visualized paranasal sinuses and mastoids are well aerated. Tympanic cavities appear clear. Other: Postoperative changes to both globes. No orbit or scalp soft tissue injury is identified. ---------------------------------------------------- Traumatic Brain Injury Risk Stratification Skull Fracture: No -  Low/mBIG 1 Subdural Hematoma (SDH): 8mm plus - High/mBIG 3, bilateral and predominantly low-density. Subarachnoid Hemorrhage [facility]): localized, unilateral - Moderate/mBIG 2 Epidural Hematoma (EDH): No - Low/mBIG 1 Cerebral contusion, intra-axial, intraparenchymal Hemorrhage (IPH): No Intraventricular Hemorrhage (IVH): No - Low/mBIG 1 Midline Shift > 1mm or Edema/effacement of sulci/vents: Yes - High/mBIG 3, although mild. ---------------------------------------------------- IMPRESSION: 1. Positive for bilateral fairly symmetric and Low-density Subdural Hematomas, up to 9 mm thickness. Small volume right superior frontal convexity subarachnoid blood. 2. Fairly symmetric mass effect on both cerebral hemispheres, trace rightward midline shift. Basilar cisterns remain normal. 3. No skull fracture, IVH or other complicating features identified. Bilateral cerebral white matter disease.  Electronically Signed: By: Marlise Simpers M.D. On: 09/15/2023 11:41   DG Chest 2 View Result Date: 09/15/2023 CLINICAL DATA:  78 year old male with recent fall. Cervical spine CT today. Chest CT 08/08/2022. EXAM: CHEST - 2 VIEW FINDINGS: Portable AP semi upright view at 1113 hours. Lung volumes and mediastinal contours are stable and within normal limits from the CT scout view last year. Visualized tracheal air column is within normal limits. Allowing for portable technique the lungs are clear; emphysema better demonstrated on the comparison CTs. No pneumothorax or pleural effusion. No acute osseous abnormality identified. Paucity of bowel gas. IMPRESSION: No acute cardiopulmonary abnormality or acute traumatic injury identified. Electronically Signed   By: Marlise Simpers M.D.   On: 09/15/2023 11:45   CT Cervical Spine Wo Contrast Result Date: 09/15/2023 CLINICAL DATA:  78 year old male with recent fall. EXAM: CT CERVICAL SPINE WITHOUT CONTRAST TECHNIQUE: Multidetector CT imaging of the cervical spine was performed without intravenous contrast. Multiplanar CT image reconstructions were also generated. RADIATION DOSE REDUCTION: This exam was performed according to the departmental dose-optimization program which includes automated exposure control, adjustment of the mA and/or kV according to patient size and/or use of iterative reconstruction technique. COMPARISON:  Head CT today. FINDINGS: Alignment: Maintained cervical lordosis. Cervicothoracic junction alignment is within normal limits. Bilateral posterior element alignment is within normal limits. Skull base and vertebrae: Bone mineralization is within normal limits for age. Visualized skull base is intact. No atlanto-occipital dissociation. C1 and C2 appear chronically degenerated but intact and aligned. No acute osseous abnormality identified. Soft tissues and spinal canal: No prevertebral fluid or swelling. No visible canal hematoma. Negative visible noncontrast neck  soft tissues, mild calcified carotid atherosclerosis. Disc levels: Advanced cervical spine degeneration. Bilateral upper cervical predominant facet arthropathy. Bulky disc and endplate degeneration with vacuum disc at C5-C6 and C6-C7. Up to moderate spinal stenosis associated at C5-C6. Upper chest: Visible upper thoracic levels appear intact. Partially calcified apical, pleural lung scarring and evidence of underlying centrilobular emphysema. Negative noncontrast thoracic inlet soft tissues. IMPRESSION: 1. No acute traumatic injury identified in the cervical spine. 2. Advanced cervical spine degeneration. Up to moderate associated spinal stenosis suspected at C5-C6. 3.  Emphysema (ICD10-J43.9). Electronically Signed   By: Marlise Simpers M.D.   On: 09/15/2023 11:44   Assessment/Plan [name] is a 78 y.o. male with medical history significant for anxiety, alcoholism and alcoholic cirrhosis of the liver presents to the emergency department with bilateral arm skin tears after having a fall at home 2 days ago, found to have bilateral subdural hematomas which neurosurgery feels are chronic.   Weakness and falls at home-resulting in bilateral arm skin tears, and potentially previous fall resulting in bilateral subdural hematoma addressed below. -Observation admission -Fall precautions -PT/OT consult -Wound care consult for management of skin tears  Elevated lactic acid-patient  without evidence of sepsis, I feel this is likely due to dehydration from poor oral intake.  Will trend this as he is hydrated.  UTI-suspected due to recent weakness/fall and abnormal urinalysis.  However, patient without fevers or UTI symptoms. -Will treat empirically with IV Rocephin as no other etiology of his weakness and falls has been found  Bilateral subdural hematoma-with fairly symmetric mass effect on both hemispheres.  Unable to evaluate chronicity based on CT scan findings, but ER provider Dr. Synetta Eves discussed with Dr.  Nat Badger who feels that these are chronic, and that certainly no surgical intervention is indicated.  Potentially these developed after he fell and hit his head about 4 months ago according to the patient. -Avoid blood thinners -Outpatient neurosurgery follow-up, with repeat CT scan in 2 to 3 weeks.  History of alcohol  abuse-with known liver cirrhosis -Thiamine , folate, multivitamin -P.o. Ativan per CIWA protocol  Anxiety-takes alprazolam  at home as needed, will hold this while he is getting lorazepam per CIWA protocol  Hyperlipidemia-hold home Crestor in the setting of mildly elevated LFTs  DVT prophylaxis: SCDs only     Code Status: Limited: Do not attempt resuscitation (DNR) -DNR-LIMITED -Do Not Intubate/DNI  -CODE STATUS was discussed with the patient, who is awake alert oriented and a good historian -She indicates he wants no aggressive life saving measures, he prefers to be DNR/DNI  Consults called: Dr. Synetta Eves spoke with NSG Dr. Nat Badger  Admission status: Observation   Time spent: 49 minutes  Idabell Picking Rickey Charm MD [facility] Pager [phone]  If 7PM-7AM, please contact night-coverage [email or web address] Password [facility]  09/15/2023, 1:38 PM

## 2023-09-15 NOTE — Consult Note (Signed)
 WOC consult requested for bilat arm skin tears.  Secure chat sent to the primary team as follows: "Please note that the WOC nursing team is utilizing a standardized work plan to manage patient consults. We are triaging consults and will try to see the patients within 48 hours. Wound photos in the patient's chart allow us  to consult on the patient in the most efficient and timely manner." Thank you Wiliam Harder MSN, RN, Bobbe Burner, [place], CNS [phone]

## 2023-09-15 NOTE — ED Notes (Signed)
 Patient is resting comfortably.

## 2023-09-15 NOTE — ED Provider Notes (Signed)
 [place] [facility]ERGENCY DEPARTMENT AT [facility] Provider Note   CSN: [id number] Arrival date & time: 09/15/23  2130     History {Add pertinent medical, surgical, social history, OB history to HPI:1} Chief Complaint  Patient presents with   Hypoglycemia    Pt arrives via EMS today for fall 3 days ago, no headstrike, -LOC, -thinners. Ambulance arrived, did FBS got 23. Given oral glucagon, pts BS dropped to 13. Given dextrose, pt up to 252, no hx diabetes. Pt arrives discheveled, with a large abrasion on L arm, bleeding controlled but skin is open. A&Ox4, no complaints of pain. LACIV in place. Pupils size 1, pt takes ambien, took 1/2 xanax  before coming in     [name] is a 78 y.o. male.  HPI 79 year old man history of alcohol  abuse, anxiety disorder, severe protein calorie malnutrition, hyponatremia, thrombocytopenia, impaired fasting glucose, presents today complaining of fall.  He states that 2 nights ago he had a fall where he lost his balance getting up to turn the light off.  He had multiple abrasions and skin tears to his forearms.  He had to crawl down the hallway.  He was eventually able to get back up.  He is unclear as to why he waited 2 days to get help.  He states that he has been up and around and eating normally.  He states that he then used the thing that he wears around his neck to call for help and eventually had EMS come today.  Per report via nursing initial fingerstick was 23.  He was given oral glucagon and his blood sugar dropped to 13.  He was given dextrose and blood sugar came up to 252.  Per the report his pupils were 1 he took Ambien and Xanax  before coming in.  He reports that his last alcohol  was last night.  He denies any headache or head injury, blood thinner use, neck pain, chest pain, cough, dyspnea, abdominal pain, nausea, vomiting, diarrhea, lower extremity pain or injury.  He states he has been ambulatory since this occurred.  He has skin tears to  both of his forearms that have dried gauze on them.     Home Medications Prior to Admission medications   Medication Sig Start Date End Date Taking? Authorizing Provider  ALPRAZolam  (XANAX ) 0.5 MG tablet Take 1 tablet by mouth as needed. 02/21/15   [provider]  ketorolac (ACULAR) 0.5 % ophthalmic solution 1 drop 4 (four) times daily as needed.    [provider]  methocarbamol (ROBAXIN) 500 MG tablet Take 1 tablet by mouth as needed. 08/14/15   [provider]  rosuvastatin (CRESTOR) 10 MG tablet Take 10 mg by mouth at bedtime. 07/10/21   [provider]  zolpidem (AMBIEN) 10 MG tablet Take 1 tablet by mouth at bedtime as needed. 09/03/20   [provider]      Allergies    Patient has no known allergies.    Review of Systems   Review of Systems  Physical Exam Updated Vital Signs BP (!) 149/100   Pulse 92   Temp 98 F (36.7 C) (Oral)   Resp 18   SpO2 99%  Physical Exam Vitals reviewed.  HENT:     Head: Normocephalic.     Right Ear: External ear normal.     Left Ear: External ear normal.     Nose: Nose normal.     Mouth/Throat:     Pharynx: Oropharynx is clear.  Eyes:  Extraocular Movements: Extraocular movements intact.     Pupils: Pupils are equal, round, and reactive to light.  Cardiovascular:     Rate and Rhythm: Tachycardia present.     Pulses: Normal pulses.  Pulmonary:     Effort: Pulmonary effort is normal.     Breath sounds: Normal breath sounds.  Abdominal:     General: Abdomen is flat. Bowel sounds are normal.  Musculoskeletal:     Cervical back: Normal range of motion.     Comments: Bilateral upper extremities examined with no obvious deformities but significant skin tears to bilateral forearms Abrasions contusions to bilateral upper and lower legs Chronic skin changes bilateral lower legs Feet without any signs of wound and pulses intact Back examined and no obvious signs of trauma no point tenderness  over cervical, thoracic, or lumbar spine Pelvis is stable  Skin:    Capillary Refill: Capillary refill takes less than 2 seconds.  Neurological:     General: No focal deficit present.     Mental Status: He is alert and oriented to person, place, and time.     Cranial Nerves: No cranial nerve deficit.     Motor: No weakness.  Psychiatric:        Mood and Affect: Mood normal.     ED Results / Procedures / Treatments   Labs (all labs ordered are listed, but only abnormal results are displayed) Labs Reviewed  COMPREHENSIVE METABOLIC PANEL WITH GFR - Abnormal; Notable for the following components:      Result Value   Sodium 134 (*)    Potassium 3.2 (*)    Chloride 97 (*)    Glucose, Bld 146 (*)    BUN 5 (*)    Creatinine, Ser 0.36 (*)    Albumin  3.2 (*)    AST 68 (*)    Alkaline Phosphatase 149 (*)    All other components within normal limits  CBC WITH DIFFERENTIAL/PLATELET - Abnormal; Notable for the following components:   RBC 4.19 (*)    MCV 102.1 (*)    MCH 34.6 (*)    All other components within normal limits  URINALYSIS, W/ REFLEX TO CULTURE (INFECTION SUSPECTED) - Abnormal; Notable for the following components:   Color, Urine STRAW (*)    Specific Gravity, Urine 1.001 (*)    Glucose, UA >=500 (*)    Leukocytes,Ua LARGE (*)    All other components within normal limits  I-STAT CG4 LACTIC ACID, ED - Abnormal; Notable for the following components:   Lactic Acid, Venous 2.0 (*)    All other components within normal limits  CBG MONITORING, ED - Abnormal; Notable for the following components:   Glucose-Capillary 157 (*)    All other components within normal limits  CULTURE, BLOOD (ROUTINE X 2)  CULTURE, BLOOD (ROUTINE X 2)  URINE CULTURE  AMMONIA  BLOOD GAS, VENOUS  ETHANOL  PROTIME-INR  CBG MONITORING, ED  CBG MONITORING, ED  I-STAT CG4 LACTIC ACID, ED    EKG None  Radiology CT Head Wo Contrast Addendum Date: 09/15/2023 ADDENDUM REPORT: 09/15/2023 12:09  ADDENDUM: Study discussed by telephone with Dr. Diego Foy Reet Scharrer on 09/15/2023 at 1151 hours. Electronically Signed   By: Marlise Simpers M.D.   On: 09/15/2023 12:09   Result Date: 09/15/2023 CLINICAL DATA:  78 year old male with recent fall. EXAM: CT HEAD WITHOUT CONTRAST TECHNIQUE: Contiguous axial images were obtained from the base of the skull through the vertex without intravenous contrast. RADIATION DOSE REDUCTION: This exam was performed  according to the departmental dose-optimization program which includes automated exposure control, adjustment of the mA and/or kV according to patient size and/or use of iterative reconstruction technique. COMPARISON:  None Available. FINDINGS: Brain: Positive for bilateral low-density subdural fluid collections (coronal image 45), 8-9 mm in thickness bilaterally. Mild mixed density bilaterally. Fairly symmetric mass effect on the cerebral hemispheres. There is minimal rightward midline shift. Basilar cisterns remain patent. Superimposed small volume right superior convexity frontal subarachnoid hemorrhage best seen on sagittal image 34 (greater than 2 sulci). No intraventricular blood. No ventriculomegaly. No cerebral contusion identified. No cerebral edema or cortically based infarct identified. Scattered, moderate bilateral cerebral white matter hypodensity. No cortical encephalomalacia identified. Vascular: No suspicious intracranial vascular hyperdensity. Skull: Intact.  No fracture identified. Sinuses/Orbits: Visualized paranasal sinuses and mastoids are well aerated. Tympanic cavities appear clear. Other: Postoperative changes to both globes. No orbit or scalp soft tissue injury is identified. ---------------------------------------------------- Traumatic Brain Injury Risk Stratification Skull Fracture: No - Low/mBIG 1 Subdural Hematoma (SDH): 8mm plus - High/mBIG 3, bilateral and predominantly low-density. Subarachnoid Hemorrhage [facility]): localized, unilateral - Moderate/mBIG 2  Epidural Hematoma (EDH): No - Low/mBIG 1 Cerebral contusion, intra-axial, intraparenchymal Hemorrhage (IPH): No Intraventricular Hemorrhage (IVH): No - Low/mBIG 1 Midline Shift > 1mm or Edema/effacement of sulci/vents: Yes - High/mBIG 3, although mild. ---------------------------------------------------- IMPRESSION: 1. Positive for bilateral fairly symmetric and Low-density Subdural Hematomas, up to 9 mm thickness. Small volume right superior frontal convexity subarachnoid blood. 2. Fairly symmetric mass effect on both cerebral hemispheres, trace rightward midline shift. Basilar cisterns remain normal. 3. No skull fracture, IVH or other complicating features identified. Bilateral cerebral white matter disease. Electronically Signed: By: Marlise Simpers M.D. On: 09/15/2023 11:41   DG Chest 2 View Result Date: 09/15/2023 CLINICAL DATA:  78 year old male with recent fall. Cervical spine CT today. Chest CT 08/08/2022. EXAM: CHEST - 2 VIEW FINDINGS: Portable AP semi upright view at 1113 hours. Lung volumes and mediastinal contours are stable and within normal limits from the CT scout view last year. Visualized tracheal air column is within normal limits. Allowing for portable technique the lungs are clear; emphysema better demonstrated on the comparison CTs. No pneumothorax or pleural effusion. No acute osseous abnormality identified. Paucity of bowel gas. IMPRESSION: No acute cardiopulmonary abnormality or acute traumatic injury identified. Electronically Signed   By: Marlise Simpers M.D.   On: 09/15/2023 11:45   CT Cervical Spine Wo Contrast Result Date: 09/15/2023 CLINICAL DATA:  78 year old male with recent fall. EXAM: CT CERVICAL SPINE WITHOUT CONTRAST TECHNIQUE: Multidetector CT imaging of the cervical spine was performed without intravenous contrast. Multiplanar CT image reconstructions were also generated. RADIATION DOSE REDUCTION: This exam was performed according to the departmental dose-optimization program which  includes automated exposure control, adjustment of the mA and/or kV according to patient size and/or use of iterative reconstruction technique. COMPARISON:  Head CT today. FINDINGS: Alignment: Maintained cervical lordosis. Cervicothoracic junction alignment is within normal limits. Bilateral posterior element alignment is within normal limits. Skull base and vertebrae: Bone mineralization is within normal limits for age. Visualized skull base is intact. No atlanto-occipital dissociation. C1 and C2 appear chronically degenerated but intact and aligned. No acute osseous abnormality identified. Soft tissues and spinal canal: No prevertebral fluid or swelling. No visible canal hematoma. Negative visible noncontrast neck soft tissues, mild calcified carotid atherosclerosis. Disc levels: Advanced cervical spine degeneration. Bilateral upper cervical predominant facet arthropathy. Bulky disc and endplate degeneration with vacuum disc at C5-C6 and C6-C7. Up to moderate spinal  stenosis associated at C5-C6. Upper chest: Visible upper thoracic levels appear intact. Partially calcified apical, pleural lung scarring and evidence of underlying centrilobular emphysema. Negative noncontrast thoracic inlet soft tissues. IMPRESSION: 1. No acute traumatic injury identified in the cervical spine. 2. Advanced cervical spine degeneration. Up to moderate associated spinal stenosis suspected at C5-C6. 3.  Emphysema (ICD10-J43.9). Electronically Signed   By: Marlise Simpers M.D.   On: 09/15/2023 11:44    Procedures Procedures  {Document cardiac monitor, telemetry assessment procedure when appropriate:1}  Medications Ordered in ED Medications  lactated ringers infusion (has no administration in time range)  ceFEPIme (MAXIPIME) 2 g in sodium chloride  0.9 % 100 mL IVPB (has no administration in time range)  metroNIDAZOLE (FLAGYL) IVPB 500 mg (has no administration in time range)  vancomycin (VANCOCIN) IVPB 1000 mg/200 mL premix (has no  administration in time range)  lactated ringers bolus 1,000 mL (has no administration in time range)    ED Course/ Medical Decision Making/ A&P   {   Click here for ABCD2, HEART and other calculatorsREFRESH Note before signing :1}                              Medical Decision Making Amount and/or Complexity of Data Reviewed Labs: ordered. Radiology: ordered.  Risk Prescription drug management.   78 year old male history of alcohol  abuse presents today stating that he fell 2 days ago.  It is unclear what happened between fall and presentation today but patient states he has been up and ambulatory.  He reports that he used the call that he wears around his neck and had EMS come today.  They state that he did not strike his head and did not have a loss of consciousness.  However, they noted his blood sugar to be 23 and 13 there.  He received glucagon and dextrose.  They report that he did not have any change in his mental status with this. Here in the ED he is complaining of the wounds to his arms from skin tears a couple of days ago.  But does not really complaining of any significant pain.  He is awake and alert to year and day of the week and month.  Not have any focal neurological deficit on his exam. On evaluation, patient was mildly tachycardic. Labs, blood culture, imaging obtained. Received call and discussed head CT with radiologist.  Patient with large bilateral subdural hematoma with trace amount of subarachnoid hemorrhage. Page placed to neurosurgery CBC reviewed without acute abnormalities Complete metabolic panel obtained significant for mild hyponatremia hypokalemia and elevated AST and alk phos Lactic acid obtained and is slightly elevated at 2 Urine obtained significant for than 50 white blood cells Patient being treated for sepsis with elevated lactic and UTI 1 fall unclear etiology 2 bilateral subdural hematomas-discussed CT results with Dr. Del Favia 1 for radiology  3  multiple skin tears 4 UTI with cyst without hypotension,/no evidence of shock 5 history of alcohol  abuse-patient with some possible [place] withdrawal but is currently not tachycardic although mildly hypertensive   Consulted neurosurgery.  Care discussed with Dr. Nat Badger who will place consultation note.  He does not feel the patient needs any intervention and would be unlikely to intervene on patient's history.  Advised patient can safely be admitted at [facility] placed to hospitalist for admission {Document critical care time when appropriate:1} {Document review of labs and clinical decision tools ie heart score, Chads2Vasc2  etc:1}  {Document your independent review of radiology images, and any outside records:1} {Document your discussion with family members, caretakers, and with consultants:1} {Document social determinants of health affecting pt's care:1} {Document your decision making why or why not admission, treatments were needed:1} Final Clinical Impression(s) / ED Diagnoses Final diagnoses:  None    Rx / DC Orders ED Discharge Orders     None

## 2023-09-15 NOTE — Consult Note (Addendum)
 WOC Nurse Consult Note: per H&P patient with some fresh and some old skin tears r/t falls  Reason for Consult: B upper arm skin tears, wounds legs  Wound type: partial thickness r/t trauma (falls)  Pressure Injury POA: NA, not related to pressure  Measurement: see nursing flowsheet  Wound bed: red and moist  Drainage (amount, consistency, odor)  Periwound:  Dressing procedure/placement/frequency: Cleanse skin tears with NS, apply Vaseline gauze ([facility] [phone]) to wound beds, cover with Telfa nonstick dressing and secure with Kerlix roll gauze and tape.  Change every other day. IF DRESSING IS STUCK TO WOUND BEDS SOAK IN NS FOR ATRAUMATIC REMOVAL.  Cleanse leg skin tears with NS, apply Xeroform gauze ([facility]son #[phone]), Telfa nonstick dressing and silicone foam or Kerlix roll gauze whichever is preferred.   POC discussed with bedside nurse. WOC team will not follow. Re-consult if further needs arise.   Thank you,    Ronni Colace MSN, RN-BC, [employer] [phone]

## 2023-09-15 NOTE — Progress Notes (Signed)
Elink is following Code Sepsis 

## 2023-09-15 NOTE — ED Notes (Signed)
 Took old dried dressings off of patient's arms. Dressings were black/brown from dried blood, and were melted into skin. I used normal saline to wet the dressings and let them marinate before peeling off. Due to some damage from the previous dressings, during removal some of the scabs came off and some new skin was exposed and was bleeding slightly. Controlled, and wrapped (r arm) left arm ota, but I will be wrapping in tegaderm and vasaline per provider request

## 2023-09-15 NOTE — Progress Notes (Signed)
 Called by Dr. Synetta Eves for patient presenting after fall 2 days ago. Hypoglycemic in ED but by report with non-focal neurological exam. CTH personally reviewed and demonstrates bilateral chronic subdural hematomas, minimal local mass effect. No HCP. Certainly does not require operative treatment at this point. Can safely be admitted at [facility] if needed, would plan on outpatient f/u in [facility] clinic with repeat scan in 2-3 weeks.  Augusto Blonder, MD [facility]

## 2023-09-15 NOTE — ED Notes (Signed)
 Patient denies pain and is resting comfortably.

## 2023-09-15 NOTE — ED Notes (Signed)
 When bringing patient upstairs noticed an additional skin tear on L knee. [name], no bleeding at this time

## 2023-09-16 ENCOUNTER — Encounter (HOSPITAL_COMMUNITY): Payer: Self-pay | Admitting: Internal Medicine

## 2023-09-16 DIAGNOSIS — S81812A Laceration without foreign body, left lower leg, initial encounter: Secondary | ICD-10-CM | POA: Diagnosis present

## 2023-09-16 DIAGNOSIS — S7011XA Contusion of right thigh, initial encounter: Secondary | ICD-10-CM | POA: Diagnosis present

## 2023-09-16 DIAGNOSIS — S41112A Laceration without foreign body of left upper arm, initial encounter: Secondary | ICD-10-CM | POA: Diagnosis present

## 2023-09-16 DIAGNOSIS — S41111A Laceration without foreign body of right upper arm, initial encounter: Secondary | ICD-10-CM | POA: Diagnosis present

## 2023-09-16 DIAGNOSIS — I10 Essential (primary) hypertension: Secondary | ICD-10-CM | POA: Diagnosis present

## 2023-09-16 DIAGNOSIS — Y900 Blood alcohol level of less than 20 mg/100 ml: Secondary | ICD-10-CM | POA: Diagnosis present

## 2023-09-16 DIAGNOSIS — S8012XA Contusion of left lower leg, initial encounter: Secondary | ICD-10-CM | POA: Diagnosis present

## 2023-09-16 DIAGNOSIS — E785 Hyperlipidemia, unspecified: Secondary | ICD-10-CM | POA: Diagnosis present

## 2023-09-16 DIAGNOSIS — R296 Repeated falls: Secondary | ICD-10-CM | POA: Diagnosis present

## 2023-09-16 DIAGNOSIS — W010XXA Fall on same level from slipping, tripping and stumbling without subsequent striking against object, initial encounter: Secondary | ICD-10-CM | POA: Diagnosis present

## 2023-09-16 DIAGNOSIS — Z66 Do not resuscitate: Secondary | ICD-10-CM | POA: Diagnosis present

## 2023-09-16 DIAGNOSIS — E872 Acidosis, unspecified: Secondary | ICD-10-CM | POA: Diagnosis present

## 2023-09-16 DIAGNOSIS — E876 Hypokalemia: Secondary | ICD-10-CM | POA: Diagnosis present

## 2023-09-16 DIAGNOSIS — S065XAA Traumatic subdural hemorrhage with loss of consciousness status unknown, initial encounter: Secondary | ICD-10-CM | POA: Diagnosis present

## 2023-09-16 DIAGNOSIS — K7030 Alcoholic cirrhosis of liver without ascites: Secondary | ICD-10-CM | POA: Diagnosis present

## 2023-09-16 DIAGNOSIS — S066XAA Traumatic subarachnoid hemorrhage with loss of consciousness status unknown, initial encounter: Secondary | ICD-10-CM | POA: Diagnosis present

## 2023-09-16 DIAGNOSIS — F17210 Nicotine dependence, cigarettes, uncomplicated: Secondary | ICD-10-CM | POA: Diagnosis present

## 2023-09-16 DIAGNOSIS — E871 Hypo-osmolality and hyponatremia: Secondary | ICD-10-CM | POA: Diagnosis present

## 2023-09-16 DIAGNOSIS — Y92099 Unspecified place in other non-institutional residence as the place of occurrence of the external cause: Secondary | ICD-10-CM | POA: Diagnosis not present

## 2023-09-16 DIAGNOSIS — I6203 Nontraumatic chronic subdural hemorrhage: Secondary | ICD-10-CM | POA: Diagnosis not present

## 2023-09-16 DIAGNOSIS — E162 Hypoglycemia, unspecified: Secondary | ICD-10-CM | POA: Diagnosis present

## 2023-09-16 DIAGNOSIS — F1020 Alcohol dependence, uncomplicated: Secondary | ICD-10-CM | POA: Diagnosis present

## 2023-09-16 DIAGNOSIS — S7012XA Contusion of left thigh, initial encounter: Secondary | ICD-10-CM | POA: Diagnosis present

## 2023-09-16 DIAGNOSIS — S40812A Abrasion of left upper arm, initial encounter: Secondary | ICD-10-CM | POA: Diagnosis present

## 2023-09-16 DIAGNOSIS — N390 Urinary tract infection, site not specified: Secondary | ICD-10-CM | POA: Diagnosis present

## 2023-09-16 DIAGNOSIS — F419 Anxiety disorder, unspecified: Secondary | ICD-10-CM | POA: Diagnosis present

## 2023-09-16 DIAGNOSIS — E860 Dehydration: Secondary | ICD-10-CM | POA: Diagnosis present

## 2023-09-16 LAB — CBC
HCT: 35.9 % — ABNORMAL LOW (ref 39.0–52.0)
Hemoglobin: 11.6 g/dL — ABNORMAL LOW (ref 13.0–17.0)
MCH: 34.8 pg — ABNORMAL HIGH (ref 26.0–34.0)
MCHC: 32.3 g/dL (ref 30.0–36.0)
MCV: 107.8 fL — ABNORMAL HIGH (ref 80.0–100.0)
Platelets: 136 10*3/uL — ABNORMAL LOW (ref 150–400)
RBC: 3.33 MIL/uL — ABNORMAL LOW (ref 4.22–5.81)
RDW: 13.2 % (ref 11.5–15.5)
WBC: 4.5 10*3/uL (ref 4.0–10.5)
nRBC: 0 % (ref 0.0–0.2)

## 2023-09-16 LAB — HEMOGLOBIN AND HEMATOCRIT, BLOOD
HCT: 37.2 % — ABNORMAL LOW (ref 39.0–52.0)
Hemoglobin: 12.3 g/dL — ABNORMAL LOW (ref 13.0–17.0)

## 2023-09-16 LAB — COMPREHENSIVE METABOLIC PANEL WITH GFR
ALT: 21 U/L (ref 0–44)
AST: 37 U/L (ref 15–41)
Albumin: 2.4 g/dL — ABNORMAL LOW (ref 3.5–5.0)
Alkaline Phosphatase: 105 U/L (ref 38–126)
Anion gap: 10 (ref 5–15)
BUN: 6 mg/dL — ABNORMAL LOW (ref 8–23)
CO2: 21 mmol/L — ABNORMAL LOW (ref 22–32)
Calcium: 7.8 mg/dL — ABNORMAL LOW (ref 8.9–10.3)
Chloride: 103 mmol/L (ref 98–111)
Creatinine, Ser: 0.34 mg/dL — ABNORMAL LOW (ref 0.61–1.24)
GFR, Estimated: >60 mL/min (ref >60)
Glucose, Bld: 79 mg/dL (ref 70–99)
Potassium: 3.1 mmol/L — ABNORMAL LOW (ref 3.5–5.1)
Sodium: 134 mmol/L — ABNORMAL LOW (ref 135–145)
Total Bilirubin: 1.3 mg/dL — ABNORMAL HIGH (ref 0.0–1.2)
Total Protein: 4.9 g/dL — ABNORMAL LOW (ref 6.5–8.1)

## 2023-09-16 LAB — MAGNESIUM: Magnesium: 1.6 mg/dL — ABNORMAL LOW (ref 1.7–2.4)

## 2023-09-16 MED ORDER — POTASSIUM CHLORIDE CRYS ER 20 MEQ PO TBCR
40.0000 meq | EXTENDED_RELEASE_TABLET | ORAL | Status: AC
  Administered 2023-09-16 (×2): 40 meq via ORAL
  Filled 2023-09-16 (×2): qty 2

## 2023-09-16 MED ORDER — MAGNESIUM SULFATE 4 GM/100ML IV SOLN
4.0000 g | Freq: Once | INTRAVENOUS | Status: AC
  Administered 2023-09-16: 4 g via INTRAVENOUS
  Filled 2023-09-16 (×2): qty 100

## 2023-09-16 NOTE — Evaluation (Signed)
 Physical Therapy Evaluation Patient Details Name: [name] MRN: [id number] DOB: November 23, 1945 Today's Date: 09/16/2023  History of Present Illness  Pt presents with Bil arm abrasions due to a recent fall at home and intracranial subdural hemotoma thought to be from another fall a few months prior. Pt with PMH to include anxiety, ETOH, and cirrosis of the liver.  Clinical Impression  Pt presents with dependencies in mobility secondary to the above diagnosis. Pt is a very high fall risk. Pt lives alone and is requesting d/c back home. Pt required min assist for balance ambulating without an assistive device. Pt with decreased balance and general weakness. Pt has a history of ETOH abuse and I do not recommend d/c home alone. Pt acknowledged his balance was a little off and he stated he has a RW at home and would use it. Pt is requesting d/c home with HHPT services to improve his balance and general mobility. Pt will continue to benefit from acute skilled PT to maximize mobility and independence for the next venue of care.       If plan is discharge home, recommend the following: A little help with walking and/or transfers;Assistance with cooking/housework;Assist for transportation;Help with stairs or ramp for entrance   Can travel by private vehicle   Yes    Equipment Recommendations None recommended by PT  Recommendations for Other Services       Functional Status Assessment Patient has had a recent decline in their functional status and demonstrates the ability to make significant improvements in function in a reasonable and predictable amount of time.     Precautions / Restrictions Precautions Precautions: Fall Restrictions Weight Bearing Restrictions Per Provider Order: No      Mobility  Bed Mobility Overal bed mobility: Needs Assistance Bed Mobility: Supine to Sit     Supine to sit: Min assist     General bed mobility comments: min assist to provide trunck support for  initial rise up, supervision with VC to go from sit to supine with HOB elevated.    Transfers Overall transfer level: Needs assistance Equipment used: None Transfers: Sit to/from Stand Sit to Stand: Min assist           General transfer comment: steady assist for balance    Ambulation/Gait Ambulation/Gait assistance: Min assist, Contact guard assist Gait Distance (Feet): 225 Feet Assistive device: 1 person hand held assist, Rolling walker (2 wheels) Gait Pattern/deviations: Step-to pattern, Decreased stride length, Staggering left, Staggering right       General Gait Details: unsteady without RW- Pt ambulated with RW CGA 200 ft with cues to slow down for increased safety. Pt ambulated without AD and he required assistance to maintain balance.  Stairs            Wheelchair Mobility     Tilt Bed    Modified Rankin (Stroke Patients Only)       Balance Overall balance assessment: Needs assistance Sitting-balance support: No upper extremity supported, Feet supported Sitting balance-Leahy Scale: Good     Standing balance support: No upper extremity supported, During functional activity Standing balance-Leahy Scale: Poor                               Pertinent Vitals/Pain Pain Assessment Pain Assessment: No/denies pain    Home Living Family/patient expects to be discharged to:: Private residence Living Arrangements: Alone Available Help at Discharge: Available PRN/intermittently Type of Home: House Home Access:  Stairs to enter Entrance Stairs-Rails: Can reach both Entrance Stairs-Number of Steps: 5   Home Layout: Two level;Able to live on main level with bedroom/bathroom (hobby room is upstairs) Home Equipment: Agricultural consultant (2 wheels)      Prior Function Prior Level of Function : Driving;History of Falls (last six months);Independent/Modified Independent                     Extremity/Trunk Assessment   Upper Extremity  Assessment Upper Extremity Assessment: Defer to OT evaluation    Lower Extremity Assessment Lower Extremity Assessment: Generalized weakness       Communication   Communication Communication: No apparent difficulties    Cognition Arousal: Alert Behavior During Therapy: WFL for tasks assessed/performed   PT - Cognitive impairments: Safety/Judgement                         Following commands: Intact       Cueing Cueing Techniques: Verbal cues     General Comments      Exercises     Assessment/Plan    PT Assessment Patient needs continued PT services  PT Problem List Decreased strength;Decreased balance;Decreased range of motion;Decreased mobility;Decreased activity tolerance;Decreased safety awareness       PT Treatment Interventions DME instruction;Functional mobility training;Balance training;Patient/family education;Gait training;Therapeutic activities;Therapeutic exercise;Stair training    PT Goals (Current goals can be found in the Care Plan section)  Acute Rehab PT Goals Patient Stated Goal: to return home PT Goal Formulation: With patient Time For Goal Achievement: 09/30/23 Potential to Achieve Goals: Good    Frequency Min 3X/week     Co-evaluation               AM-PAC PT "6 Clicks" Mobility  Outcome Measure Help needed turning from your back to your side while in a flat bed without using bedrails?: A Little Help needed moving from lying on your back to sitting on the side of a flat bed without using bedrails?: A Little Help needed moving to and from a bed to a chair (including a wheelchair)?: A Little Help needed standing up from a chair using your arms (e.g., wheelchair or bedside chair)?: A Little Help needed to walk in hospital room?: A Little Help needed climbing 3-5 steps with a railing? : A Little 6 Click Score: 18    End of Session Equipment Utilized During Treatment: Gait belt Activity Tolerance: Patient tolerated  treatment well Patient left: in bed;with family/visitor present;with call bell/phone within reach;with bed alarm set Nurse Communication: Mobility status PT Visit Diagnosis: Difficulty in walking, not elsewhere classified (R26.2);Muscle weakness (generalized) (M62.81);Repeated falls (R29.6);Unsteadiness on feet (R26.81)    Time: 1610-9604 PT Time Calculation (min) (ACUTE ONLY): 32 min   Charges:   PT Evaluation $PT Eval Moderate Complexity: 1 Mod PT Treatments $Gait Training: 8-22 mins PT General Charges $$ ACUTE PT VISIT: 1 Visit         Amberlie Gaillard Kerstine 09/16/2023, 11:17 AM

## 2023-09-16 NOTE — Progress Notes (Addendum)
 PROGRESS NOTE    [name]  WUJ:[id number]  DOB: 1945/09/05  DOA: 09/15/2023 PCP: [name]., MD Outpatient Specialists:   Hospital course:  78 year old man with ongoing alcohol  use, cirrhosis, multiple falls was admitted after presenting with skin lacerations after another fall.  Workup revealed UTI as well as bilateral subdural hematomas.  Patient lives alone.   Subjective:  Patient states he feels well.  Notes he does not think he felt unwell yesterday.  Feels just the same.  Denies headache.  Denies any recent head trauma.  Does admit to some memory difficulties.   Objective: Vitals:   09/16/23 0929 09/16/23 1029 09/16/23 1129 09/16/23 1200  BP: (!) 122/90 110/85 101/77 123/81  Pulse: 90 88 86 93  Resp: 20     Temp: 97.6 F (36.4 C)   98.1 F (36.7 C)  TempSrc: Oral   Oral  SpO2:    96%  Weight:      Height:        Intake/Output Summary (Last 24 hours) at 09/16/2023 1555 Last data filed at 09/16/2023 1300 Gross per 24 hour  Intake 2113.89 ml  Output --  Net 2113.89 ml   Filed Weights   09/15/23 2004  Weight: 65 kg     Exam:  General: Patient in good spirits sitting up in bed in NAD with forearms wrapped in gauze Eyes: sclera anicteric, conjuctiva mild injection bilaterally CVS: S1-S2, regular  Respiratory:  decreased air entry bilaterally secondary to decreased inspiratory effort, rales at bases  GI: NABS, soft, NT  LE: Warm and well-perfused Neuro: A/O x 3,  grossly nonfocal.  Psych: patient is logical and coherent, judgement and insight is poor, mood and affect appropriate to situation.  Data Reviewed:  Basic Metabolic Panel: Recent Labs  Lab 09/15/23 1114 09/16/23 0445  NA 134* 134*  K 3.2* 3.1*  CL 97* 103  CO2 29 21*  GLUCOSE 146* 79  BUN 5* 6*  CREATININE 0.36* 0.34*  CALCIUM 8.9 7.8*  MG  --  1.6*    CBC: Recent Labs  Lab 09/15/23 1114 09/16/23 0445 09/16/23 1325  WBC 6.9 4.5  --   NEUTROABS 5.5  --   --    HGB 14.5 11.6* 12.3*  HCT 42.8 35.9* 37.2*  MCV 102.1* 107.8*  --   PLT 169 136*  --      Scheduled Meds:  folic acid   1 mg Oral Daily   multivitamin with minerals  1 tablet Oral Daily   thiamine   100 mg Oral Daily   Or   thiamine   100 mg Intravenous Daily   Continuous Infusions:  cefTRIAXone (ROCEPHIN)  IV 1 g (09/16/23 0940)     Assessment & Plan:   Multiple falls  Disposition for safe discharge Appreciate PT consult who are recommending SNF for acute PT Patient however would like to go home, patient lives alone Will discuss with patient's daughter [name]  Bilateral subdural hematomas CT reviewed by neurosurgery Dr. Nat White given nonfocal neuro exam He notes that this is most likely chronic/subacute No further neurosurgery involvement needed as an inpatient Can follow-up with outpatient neurosurgery clinic with repeat scan in 2 to 3 weeks  UTI Likely contributory to most recent fall Continue ceftriaxone day #2  Hypokalemia Will replete and recheck  Hypomagnesemia Will replete and recheck  Alcohol  use Patient states he drinks 1 glass of wine a day Given abnormal LFTs, he was likely drinking more, patient agrees he drinks probably more On CIWA  protocol, thiamine  folate and MVI Patient in denial/precontemplation regarding stopping    DVT prophylaxis: SCD Code Status: DNR limited Family Communication: Will discuss with patient's daughter [name]     Studies: CT Head Wo Contrast Addendum Date: 09/15/2023 ADDENDUM REPORT: 09/15/2023 12:09 ADDENDUM: Study discussed by telephone with Dr. Diego Foy White on 09/15/2023 at 1151 hours. Electronically Signed   By: [name] M.D.   On: 09/15/2023 12:09   Result Date: 09/15/2023 CLINICAL DATA:  78 year old male with recent fall. EXAM: CT HEAD WITHOUT CONTRAST TECHNIQUE: Contiguous axial images were obtained from the base of the skull through the vertex without intravenous contrast. RADIATION DOSE REDUCTION: This exam  was performed according to the departmental dose-optimization program which includes automated exposure control, adjustment of the mA and/or kV according to patient size and/or use of iterative reconstruction technique. COMPARISON:  None Available. FINDINGS: Brain: Positive for bilateral low-density subdural fluid collections (coronal image 45), 8-9 mm in thickness bilaterally. Mild mixed density bilaterally. Fairly symmetric mass effect on the cerebral hemispheres. There is minimal rightward midline shift. Basilar cisterns remain patent. Superimposed small volume right superior convexity frontal subarachnoid hemorrhage best seen on sagittal image 34 (greater than 2 sulci). No intraventricular blood. No ventriculomegaly. No cerebral contusion identified. No cerebral edema or cortically based infarct identified. Scattered, moderate bilateral cerebral white matter hypodensity. No cortical encephalomalacia identified. Vascular: No suspicious intracranial vascular hyperdensity. Skull: Intact.  No fracture identified. Sinuses/Orbits: Visualized paranasal sinuses and mastoids are well aerated. Tympanic cavities appear clear. Other: Postoperative changes to both globes. No orbit or scalp soft tissue injury is identified. ---------------------------------------------------- Traumatic Brain Injury Risk Stratification Skull Fracture: No - Low/mBIG 1 Subdural Hematoma (SDH): 8mm plus - High/mBIG 3, bilateral and predominantly low-density. Subarachnoid Hemorrhage [facility]): localized, unilateral - Moderate/mBIG 2 Epidural Hematoma (EDH): No - Low/mBIG 1 Cerebral contusion, intra-axial, intraparenchymal Hemorrhage (IPH): No Intraventricular Hemorrhage (IVH): No - Low/mBIG 1 Midline Shift > 1mm or Edema/effacement of sulci/vents: Yes - High/mBIG 3, although mild. ---------------------------------------------------- IMPRESSION: 1. Positive for bilateral fairly symmetric and Low-density Subdural Hematomas, up to 9 mm thickness. Small  volume right superior frontal convexity subarachnoid blood. 2. Fairly symmetric mass effect on both cerebral hemispheres, trace rightward midline shift. Basilar cisterns remain normal. 3. No skull fracture, IVH or other complicating features identified. Bilateral cerebral white matter disease. Electronically Signed: By: [name] M.D. On: 09/15/2023 11:41   DG Chest 2 View Result Date: 09/15/2023 CLINICAL DATA:  78 year old male with recent fall. Cervical spine CT today. Chest CT 08/08/2022. EXAM: CHEST - 2 VIEW FINDINGS: Portable AP semi upright view at 1113 hours. Lung volumes and mediastinal contours are stable and within normal limits from the CT scout view last year. Visualized tracheal air column is within normal limits. Allowing for portable technique the lungs are clear; emphysema better demonstrated on the comparison CTs. No pneumothorax or pleural effusion. No acute osseous abnormality identified. Paucity of bowel gas. IMPRESSION: No acute cardiopulmonary abnormality or acute traumatic injury identified. Electronically Signed   By: [name] M.D.   On: 09/15/2023 11:45   CT Cervical Spine Wo Contrast Result Date: 09/15/2023 CLINICAL DATA:  78 year old male with recent fall. EXAM: CT CERVICAL SPINE WITHOUT CONTRAST TECHNIQUE: Multidetector CT imaging of the cervical spine was performed without intravenous contrast. Multiplanar CT image reconstructions were also generated. RADIATION DOSE REDUCTION: This exam was performed according to the departmental dose-optimization program which includes automated exposure control, adjustment of the mA and/or kV according to patient size and/or use of  iterative reconstruction technique. COMPARISON:  Head CT today. FINDINGS: Alignment: Maintained cervical lordosis. Cervicothoracic junction alignment is within normal limits. Bilateral posterior element alignment is within normal limits. Skull base and vertebrae: Bone mineralization is within normal limits for age.  Visualized skull base is intact. No atlanto-occipital dissociation. C1 and C2 appear chronically degenerated but intact and aligned. No acute osseous abnormality identified. Soft tissues and spinal canal: No prevertebral fluid or swelling. No visible canal hematoma. Negative visible noncontrast neck soft tissues, mild calcified carotid atherosclerosis. Disc levels: Advanced cervical spine degeneration. Bilateral upper cervical predominant facet arthropathy. Bulky disc and endplate degeneration with vacuum disc at C5-C6 and C6-C7. Up to moderate spinal stenosis associated at C5-C6. Upper chest: Visible upper thoracic levels appear intact. Partially calcified apical, pleural lung scarring and evidence of underlying centrilobular emphysema. Negative noncontrast thoracic inlet soft tissues. IMPRESSION: 1. No acute traumatic injury identified in the cervical spine. 2. Advanced cervical spine degeneration. Up to moderate associated spinal stenosis suspected at C5-C6. 3.  Emphysema (ICD10-J43.9). Electronically Signed   By: [name] M.D.   On: 09/15/2023 11:44    Principal Problem:   Chronic intracranial subdural hematoma (HCC)     Theresa Wedel Rollene Clink, [facility]  If 7PM-7AM, please contact night-coverage [email or web address]   LOS: 0 days

## 2023-09-16 NOTE — TOC Initial Note (Addendum)
 Transition of Care [facility]) - Initial/Assessment Note    Patient Details  Name: [name] MRN: [id number] Date of Birth: Jan 10, 1946  Transition of [facility]) CM/SW Contact:    Kathryn Parish, RN Phone Number: 09/16/2023, 11:14 AM  Clinical Narrative:                 MOON completed. PTA states he lives in a house alone, independent, drives, PCP/insurance verified; No DME, HH, oxygen or SDOH needs. Pt daughter, [name] [phone] will transport home at discharge. Patient denies alcohol  abuse and needing resource. TOC will follow for PT/OT needs.  Expected Discharge Plan: Home/Self Care Barriers to Discharge: Continued Medical Work up   Patient Goals and CMS Choice     Choice offered to / list presented to : NA      Expected Discharge Plan and Services   Discharge Planning Services: CM Consult Post Acute Care Choice: NA Living arrangements for the past 2 months: Single Family Home                 DME Arranged: N/A DME Agency: NA       HH Arranged: NA HH Agency: NA        Prior Living Arrangements/Services Living arrangements for the past 2 months: Single Family Home Lives with:: Self Patient language and need for interpreter reviewed:: Yes Do you feel safe going back to the place where you live?: Yes      Need for Family Participation in Patient Care: Yes (Comment) Care giver support system in place?: Yes (comment) Current home services:  (none) Criminal Activity/Legal Involvement Pertinent to Current Situation/Hospitalization: No - Comment as needed  Activities of Daily Living   ADL Screening (condition at time of admission) Independently performs ADLs?: No Does the patient have a NEW difficulty with bathing/dressing/toileting/self-feeding that is expected to last >3 days?: Yes (Initiates electronic notice to provider for possible OT consult) Does the patient have a NEW difficulty with getting in/out of bed, walking, or climbing stairs that is  expected to last >3 days?: Yes (Initiates electronic notice to provider for possible PT consult) Does the patient have a NEW difficulty with communication that is expected to last >3 days?: Yes (Initiates electronic notice to provider for possible SLP consult) Is the patient deaf or have difficulty hearing?: No Does the patient have difficulty seeing, even when wearing glasses/contacts?: No Does the patient have difficulty concentrating, remembering, or making decisions?: No  Permission Sought/Granted Permission sought to share information with : Case Manager Permission granted to share information with : Yes, Verbal Permission Granted  Share Information with NAME: Pattie Borders     Permission granted to share info w Relationship: daughter  Permission granted to share info w Contact Information: [phone]  Emotional Assessment Appearance:: Appears stated age Attitude/Demeanor/Rapport: Engaged Affect (typically observed): Appropriate Orientation: : Oriented to Self, Oriented to Place, Oriented to  Time, Oriented to Situation Alcohol  / Substance Use: Alcohol  Use Psych Involvement: No (comment)  Admission diagnosis:  Chronic intracranial subdural hematoma (HCC) [I62.03] Patient Active Problem List   Diagnosis Date Noted   Chronic intracranial subdural hematoma (HCC) 09/15/2023   Hyponatremia 05/28/2011   Thrombocytopenia (HCC) 05/28/2011   Heme positive stool 05/25/2011   Alcoholism (HCC) 05/25/2011   Anxiety disorder 05/25/2011   Severe protein-calorie malnutrition (HCC) 05/25/2011   PCP:  Jeannine Milroy., MD Pharmacy:  No Pharmacies Listed    Social Drivers of Health (SDOH) Social History: SDOH Screenings   Food Insecurity: No  Food Insecurity (09/15/2023)  Housing: Low Risk  (09/15/2023)  Transportation Needs: No Transportation Needs (09/15/2023)  Utilities: Not At Risk (09/15/2023)  Social Connections: Moderately Isolated (09/15/2023)  Tobacco Use: High Risk (09/16/2023)    SDOH Interventions:     Readmission Risk Interventions     No data to display

## 2023-09-16 NOTE — Evaluation (Signed)
 Occupational Therapy Evaluation Patient Details Name: [name] MRN: [id number] DOB: 1945-09-19 Today's Date: 09/16/2023   History of Present Illness   Pt presents with B UE abrasions due to a recent fall at home and intracranial subdural hemotoma thought to be from another fall a few months prior. Pt with PMH to include anxiety, ETOH, and cirrosis of the liver.     Clinical Impressions The pt is currently presenting with the below listed deficits (see OT problem list). As such, he requires CGA-min assist for supine to sit, sit to stand, and toileting at bathroom level. He requires mod assist for lower body dressing. He is also noted to be with slight deconditioning, subjective reports of feeling weaker than normal and unsteadiness with dynamic standing activities. He reported having 2 prior falls. He will benefit from further OT services to maximize his safety and independence with self-care tasks. He expressed an interest to return home with home therapy at discharge. Should he return home, intermittent supervision is recommended.       If plan is discharge home, recommend the following:   A little help with walking and/or transfers;A little help with bathing/dressing/bathroom;Assist for transportation;Assistance with cooking/housework;Help with stairs or ramp for entrance     Functional Status Assessment   Patient has had a recent decline in their functional status and demonstrates the ability to make significant improvements in function in a reasonable and predictable amount of time.     Equipment Recommendations   None recommended by OT     Recommendations for Other Services         Precautions/Restrictions   Precautions Precautions: Fall Restrictions Weight Bearing Restrictions Per Provider Order: No     Mobility Bed Mobility Overal bed mobility: Needs Assistance Bed Mobility: Supine to Sit, Sit to Supine     Supine to sit: Contact guard, Used rails,  HOB elevated Sit to supine: Supervision        Transfers Overall transfer level: Needs assistance Equipment used: Rolling walker (2 wheels) Transfers: Sit to/from Stand Sit to Stand: Contact guard assist                  Balance       Sitting balance - Comments: static sitting-good. dynamic sitting-fair+       Standing balance comment: CGA to min assist with RW           ADL either performed or assessed with clinical judgement   ADL Overall ADL's : Needs assistance/impaired Eating/Feeding: Independent;Sitting   Grooming: Set up;Sitting           Upper Body Dressing : Set up;Sitting   Lower Body Dressing: Moderate assistance;Sitting/lateral leans       Toileting- Clothing Manipulation and Hygiene: Contact guard assist;Sit to/from stand;Cueing for safety               Vision   Additional Comments: He correctly read the time depicted on the wall clock.            Pertinent Vitals/Pain Pain Assessment Pain Assessment: No/denies pain     Extremity/Trunk Assessment Upper Extremity Assessment Upper Extremity Assessment: Overall WFL for tasks assessed;Right hand dominant   Lower Extremity Assessment Lower Extremity Assessment: Generalized weakness       Communication Communication Communication: No apparent difficulties   Cognition Arousal: Alert Behavior During Therapy: WFL for tasks assessed/performed               OT - Cognition Comments: Oriented to person, place,  and time. Able to follow 1 step commands without difficulty                       Home Living Family/patient expects to be discharged to:: Private residence Living Arrangements: Alone   Type of Home: House Home Access: Stairs to enter [employer] of Steps: 6 Entrance Stairs-Rails: Right;Left Home Layout: Two level;Able to live on main level with bedroom/bathroom     Bathroom Shower/Tub: Walk-in shower         Home Equipment: Clinical biochemist (2 wheels);Shower seat          Prior Functioning/Environment Prior Level of Function : Independent/Modified Independent;Driving             Mobility Comments: Independent with ambulation. ADLs Comments: Independent with ADLs, cooking, cleaning and driving. He retired from [employer].    OT Problem List: Decreased strength;Impaired balance (sitting and/or standing);Decreased safety awareness;Decreased knowledge of use of DME or AE   OT Treatment/Interventions: Self-care/ADL training;Therapeutic exercise;Therapeutic activities;Energy conservation;Patient/family education;DME and/or AE instruction;Balance training      OT Goals(Current goals can be found in the care plan section)   Acute Rehab OT Goals OT Goal Formulation: With patient Time For Goal Achievement: 09/30/23 Potential to Achieve Goals: Good ADL Goals Pt Will Perform Grooming: with modified independence;standing Pt Will Perform Lower Body Dressing: with modified independence;sitting/lateral leans;sit to/from stand Pt Will Transfer to Toilet: with modified independence;ambulating Pt Will Perform Toileting - Clothing Manipulation and hygiene: with modified independence;sit to/from stand   OT Frequency:  Min 2X/week       AM-PAC OT "6 Clicks" Daily Activity     Outcome Measure Help from another person eating meals?: None Help from another person taking care of personal grooming?: A Little Help from another person toileting, which includes using toliet, bedpan, or urinal?: A Little Help from another person bathing (including washing, rinsing, drying)?: A Little Help from another person to put on and taking off regular upper body clothing?: A Little Help from another person to put on and taking off regular lower body clothing?: A Lot 6 Click Score: 18   End of Session Equipment Utilized During Treatment: Rolling walker (2 wheels) Nurse Communication: Mobility status  Activity Tolerance: Patient  tolerated treatment well Patient left: in bed;with call bell/phone within reach;with bed alarm set  OT Visit Diagnosis: Unsteadiness on feet (R26.81);History of falling (Z91.81)                Time: 4098-1191 OT Time Calculation (min): 16 min Charges:  OT General Charges $OT Visit: 1 Visit OT Evaluation $OT Eval Moderate Complexity: 1 Mod    Simeon Vera L Wells Gerdeman, OTR/L 09/16/2023, 4:02 PM

## 2023-09-16 NOTE — Care Management Obs Status (Signed)
 MEDICARE OBSERVATION STATUS NOTIFICATION   Patient Details  Name: Alix Dedios MRN: [id number] Date of Birth: 29-Aug-1945   Medicare Observation Status Notification Given:  Yes    Kathryn Parish, RN 09/16/2023, 10:05 AM

## 2023-09-17 DIAGNOSIS — I6203 Nontraumatic chronic subdural hemorrhage: Secondary | ICD-10-CM | POA: Diagnosis not present

## 2023-09-17 LAB — CBC
HCT: 40 % (ref 39.0–52.0)
Hemoglobin: 13.2 g/dL (ref 13.0–17.0)
MCH: 34.6 pg — ABNORMAL HIGH (ref 26.0–34.0)
MCHC: 33 g/dL (ref 30.0–36.0)
MCV: 104.7 fL — ABNORMAL HIGH (ref 80.0–100.0)
Platelets: 147 10*3/uL — ABNORMAL LOW (ref 150–400)
RBC: 3.82 MIL/uL — ABNORMAL LOW (ref 4.22–5.81)
RDW: 13 % (ref 11.5–15.5)
WBC: 4.7 10*3/uL (ref 4.0–10.5)
nRBC: 0 % (ref 0.0–0.2)

## 2023-09-17 LAB — BASIC METABOLIC PANEL WITH GFR
Anion gap: 9 (ref 5–15)
BUN: 6 mg/dL — ABNORMAL LOW (ref 8–23)
CO2: 23 mmol/L (ref 22–32)
Calcium: 7.8 mg/dL — ABNORMAL LOW (ref 8.9–10.3)
Chloride: 101 mmol/L (ref 98–111)
Creatinine, Ser: <0.3 mg/dL — ABNORMAL LOW (ref 0.61–1.24)
Glucose, Bld: 86 mg/dL (ref 70–99)
Potassium: 3.6 mmol/L (ref 3.5–5.1)
Sodium: 133 mmol/L — ABNORMAL LOW (ref 135–145)

## 2023-09-17 LAB — URINE CULTURE

## 2023-09-17 LAB — MAGNESIUM: Magnesium: 2.2 mg/dL (ref 1.7–2.4)

## 2023-09-17 NOTE — Discharge Summary (Signed)
 [name] ZOX:[id number] DOB: 02/26/1946 DOA: 09/15/2023  PCP: Jeannine Milroy., MD  Admit date: 09/15/2023  Discharge date: 09/17/2023  Admitted From: Home   disposition: Home-declined SNF admission despite PT recommendations discussed with daughter who is comfortable with taking him home  Recommendations for Outpatient Follow-up:   Follow up with PCP in 1-2 weeks  Home Health: Home health PT and home health RN Equipment/Devices: Patient has rolling walker at home Consultations: None Discharge Condition: Improved CODE STATUS: DNR limited Diet Recommendation: Regular  Diet Order             Diet regular Room service appropriate? Yes; Fluid consistency: Thin  Diet effective now                    Chief Complaint  Patient presents with   Hypoglycemia    Pt arrives via EMS today for fall 3 days ago, no headstrike, -LOC, -thinners. Ambulance arrived, did FBS got 23. Given oral glucagon, pts BS dropped to 13. Given dextrose, pt up to 252, no hx diabetes. Pt arrives discheveled, with a large abrasion on L arm, bleeding controlled but skin is open. A&Ox4, no complaints of pain. LACIV in place. Pupils size 1, pt takes ambien, took 1/2 xanax  before coming in      Brief history of present illness from the day of admission and additional interim summary     [name] is a 78 y.o. male with medical history significant for anxiety, alcoholism and alcoholic cirrhosis of the liver presents to the emergency department with bilateral arm skin tears after having a fall at home 2 days ago, found to have bilateral subdural hematomas which neurosurgery feels are chronic.  History is provided by this gentleman, who actually lives on his own and is a good historian.  He states he has 2 grown daughters who live on the  other side of [place] from him, and check on him but he does his best to live independently.  He states that he fell at home about 4 months ago and hit his head at that time, but did not seek any medical care.  However 2 days ago, he fell and broke his fall with his arms, did not lose consciousness and he is confident that he did not hit his head.  He denies any chest pain, syncope, fevers, chills, dysuria, urgency or frequency of urination.  He states that he feels like he has been eating and drinking a normal amount.                                                                   Hospital Course   Patient was treated for urinary tract infection with improvement in mental status.  Patient was seen by physical therapy who noted  that he was unsteady and recommended discharge to acute inpatient rehab.  Patient declined rehab.  This was discussed with his daughter who also noted that she felt comfortable taking care of him at home.  She believed that he was no longer drinking alcohol  and that since his UTI was taken care of he would be able to be more steady on his feet.   Multiple falls  Disposition for safe discharge Appreciate PT consult who are recommending SNF for acute PT Patient however would like to go home, patient lives alone Discussed with patient's daughter Pattie Borders who agreed with her fathers decision   Bilateral subdural hematomas CT reviewed by neurosurgery Dr. Nat [place] given nonfocal neuro exam He notes that this is most likely chronic/subacute No further neurosurgery involvement needed as an inpatient Can follow-up with outpatient neurosurgery clinic with repeat scan in 2 to 3 weeks   UTI Likely contributory to most recent fall Completed 3-day course of ceftriaxone   Hypokalemia Resolved with repletion   Hypomagnesemia Resolved with repletion   Alcohol  use Patient states he drinks 1 glass of wine a day Given abnormal LFTs, he was likely drinking more, patient agrees  he drinks probably more.  Patient did not require any Ativan or show any signs of complicated withdrawal.  Patient's daughter does not feel that he drinks in excess.   Discharge diagnosis     Principal Problem:   Chronic intracranial subdural hematoma [facility])    Discharge instructions    Discharge Instructions     Discharge instructions   Complete by: As directed    Please keep the skin tears on your arm clean, wash with warm water and mild soap once a day.  Keep it covered with Xeroform gauze and Kling.  I have requested a home health RN to come by to check your arms to make sure they are still doing well.  I have also requested home health physical therapy to help you with your balance and gait.   Discharge wound care:   Complete by: As directed    Every other day    Comments: 1.        Cleanse B arm skin tears with NS, apply Vaseline gauze (Lawson [phone]) to wound beds, cover with Telfa nonstick dressing and secure with Kerlix roll gauze and tape.  Change every other day. IF DRESSING IS STUCK TO WOUND BEDS SOAK IN NS FOR ATRAUMATIC REMOVAL.  2.         Cleanse leg skin tears with NS, apply Xeroform gauze (Lawson [phone]), Telfa nonstick dressing and silicone foam or Kerlix roll gauze whichever is preferred.  Change every other day.       Discharge Medications   Allergies as of 09/17/2023       Reactions   Duloxetine Hcl Diarrhea   Escitalopram Diarrhea   Paroxetine    Other Reaction(s): insomnia   Sertraline Diarrhea   Triamcinolone Acetonide    Other Reaction(s): epistaxis        Medication List     STOP taking these medications    ALPRAZolam  0.5 MG tablet Commonly known as: XANAX    ketorolac 0.5 % ophthalmic solution Commonly known as: ACULAR   zolpidem 10 MG tablet Commonly known as: AMBIEN               Discharge Care Instructions  (From admission, onward)           Start     Ordered   09/17/23 0000  Discharge wound  care:       Comments: Every  other day    Comments: 1.        Cleanse B arm skin tears with NS, apply Vaseline gauze (Lawson [phone]) to wound beds, cover with Telfa nonstick dressing and secure with Kerlix roll gauze and tape.  Change every other day. IF DRESSING IS STUCK TO WOUND BEDS SOAK IN NS FOR ATRAUMATIC REMOVAL.  2.         Cleanse leg skin tears with NS, apply Xeroform gauze (Lawson [phone]), Telfa nonstick dressing and silicone foam or Kerlix roll gauze whichever is preferred.  Change every other day.   09/17/23 1324              Major procedures and Radiology Reports - PLEASE review detailed and final reports thoroughly  -       CT Head Wo Contrast Addendum Date: 09/15/2023 ADDENDUM REPORT: 09/15/2023 12:09 ADDENDUM: Study discussed by telephone with Dr. Diego Foy RAY on 09/15/2023 at 1151 hours. Electronically Signed   By: Marlise Simpers M.D.   On: 09/15/2023 12:09   Result Date: 09/15/2023 CLINICAL DATA:  78 year old male with recent fall. EXAM: CT HEAD WITHOUT CONTRAST TECHNIQUE: Contiguous axial images were obtained from the base of the skull through the vertex without intravenous contrast. RADIATION DOSE REDUCTION: This exam was performed according to the departmental dose-optimization program which includes automated exposure control, adjustment of the mA and/or kV according to patient size and/or use of iterative reconstruction technique. COMPARISON:  None Available. FINDINGS: Brain: Positive for bilateral low-density subdural fluid collections (coronal image 45), 8-9 mm in thickness bilaterally. Mild mixed density bilaterally. Fairly symmetric mass effect on the cerebral hemispheres. There is minimal rightward midline shift. Basilar cisterns remain patent. Superimposed small volume right superior convexity frontal subarachnoid hemorrhage best seen on sagittal image 34 (greater than 2 sulci). No intraventricular blood. No ventriculomegaly. No cerebral contusion identified. No cerebral edema or cortically based infarct  identified. Scattered, moderate bilateral cerebral white matter hypodensity. No cortical encephalomalacia identified. Vascular: No suspicious intracranial vascular hyperdensity. Skull: Intact.  No fracture identified. Sinuses/Orbits: Visualized paranasal sinuses and mastoids are well aerated. Tympanic cavities appear clear. Other: Postoperative changes to both globes. No orbit or scalp soft tissue injury is identified. ---------------------------------------------------- Traumatic Brain Injury Risk Stratification Skull Fracture: No - Low/mBIG 1 Subdural Hematoma (SDH): 8mm plus - High/mBIG 3, bilateral and predominantly low-density. Subarachnoid Hemorrhage [facility]): localized, unilateral - Moderate/mBIG 2 Epidural Hematoma (EDH): No - Low/mBIG 1 Cerebral contusion, intra-axial, intraparenchymal Hemorrhage (IPH): No Intraventricular Hemorrhage (IVH): No - Low/mBIG 1 Midline Shift > 1mm or Edema/effacement of sulci/vents: Yes - High/mBIG 3, although mild. ---------------------------------------------------- IMPRESSION: 1. Positive for bilateral fairly symmetric and Low-density Subdural Hematomas, up to 9 mm thickness. Small volume right superior frontal convexity subarachnoid blood. 2. Fairly symmetric mass effect on both cerebral hemispheres, trace rightward midline shift. Basilar cisterns remain normal. 3. No skull fracture, IVH or other complicating features identified. Bilateral cerebral white matter disease. Electronically Signed: By: Marlise Simpers M.D. On: 09/15/2023 11:41   DG Chest 2 View Result Date: 09/15/2023 CLINICAL DATA:  78 year old male with recent fall. Cervical spine CT today. Chest CT 08/08/2022. EXAM: CHEST - 2 VIEW FINDINGS: Portable AP semi upright view at 1113 hours. Lung volumes and mediastinal contours are stable and within normal limits from the CT scout view last year. Visualized tracheal air column is within normal limits. Allowing for portable technique the lungs are clear; emphysema better  demonstrated on the comparison  CTs. No pneumothorax or pleural effusion. No acute osseous abnormality identified. Paucity of bowel gas. IMPRESSION: No acute cardiopulmonary abnormality or acute traumatic injury identified. Electronically Signed   By: Marlise Simpers M.D.   On: 09/15/2023 11:45   CT Cervical Spine Wo Contrast Result Date: 09/15/2023 CLINICAL DATA:  78 year old male with recent fall. EXAM: CT CERVICAL SPINE WITHOUT CONTRAST TECHNIQUE: Multidetector CT imaging of the cervical spine was performed without intravenous contrast. Multiplanar CT image reconstructions were also generated. RADIATION DOSE REDUCTION: This exam was performed according to the departmental dose-optimization program which includes automated exposure control, adjustment of the mA and/or kV according to patient size and/or use of iterative reconstruction technique. COMPARISON:  Head CT today. FINDINGS: Alignment: Maintained cervical lordosis. Cervicothoracic junction alignment is within normal limits. Bilateral posterior element alignment is within normal limits. Skull base and vertebrae: Bone mineralization is within normal limits for age. Visualized skull base is intact. No atlanto-occipital dissociation. C1 and C2 appear chronically degenerated but intact and aligned. No acute osseous abnormality identified. Soft tissues and spinal canal: No prevertebral fluid or swelling. No visible canal hematoma. Negative visible noncontrast neck soft tissues, mild calcified carotid atherosclerosis. Disc levels: Advanced cervical spine degeneration. Bilateral upper cervical predominant facet arthropathy. Bulky disc and endplate degeneration with vacuum disc at C5-C6 and C6-C7. Up to moderate spinal stenosis associated at C5-C6. Upper chest: Visible upper thoracic levels appear intact. Partially calcified apical, pleural lung scarring and evidence of underlying centrilobular emphysema. Negative noncontrast thoracic inlet soft tissues. IMPRESSION: 1.  No acute traumatic injury identified in the cervical spine. 2. Advanced cervical spine degeneration. Up to moderate associated spinal stenosis suspected at C5-C6. 3.  Emphysema (ICD10-J43.9). Electronically Signed   By: Marlise Simpers M.D.   On: 09/15/2023 11:44    Micro Results    Recent Results (from the past 240 hours)  Urine Culture     Status: Abnormal   Collection Time: 09/15/23 10:51 AM   Specimen: Urine, Random  Result Value Ref Range Status   Specimen Description   Final    URINE, RANDOM Performed at [facility], [address] [place], [place] [address]    Special Requests   Final    NONE Reflexed from [id number] Performed at [facility], [address], [place], [place] [address]    Culture >=100,000 COLONIES/mL STAPHYLOCOCCUS EPIDERMIDIS (A)  Final   Report Status 09/17/2023 FINAL  Final   Organism ID, Bacteria STAPHYLOCOCCUS EPIDERMIDIS (A)  Final      Susceptibility   Staphylococcus epidermidis - MIC*    CIPROFLOXACIN <=0.5 SENSITIVE Sensitive     GENTAMICIN <=0.5 SENSITIVE Sensitive     NITROFURANTOIN <=16 SENSITIVE Sensitive     OXACILLIN <=0.25 SENSITIVE Sensitive     TETRACYCLINE <=1 SENSITIVE Sensitive     VANCOMYCIN <=0.5 SENSITIVE Sensitive     TRIMETH/SULFA <=10 SENSITIVE Sensitive     RIFAMPIN <=0.5 SENSITIVE Sensitive     Inducible Clindamycin NEGATIVE Sensitive     * >=100,000 COLONIES/mL STAPHYLOCOCCUS EPIDERMIDIS  Culture, blood (Routine x 2)     Status: None (Preliminary result)   Collection Time: 09/15/23 11:07 AM   Specimen: BLOOD  Result Value Ref Range Status   Specimen Description   Final    BLOOD LEFT ANTECUBITAL Performed at [facility], [address], [place], [place] [address]    Special Requests   Final    BOTTLES DRAWN AEROBIC AND ANAEROBIC Blood Culture adequate volume Performed at [facility], [address] [place], [place] [address]    Culture   Final    NO  GROWTH 2 DAYS Performed at [facility], [address] [place], [place] [address]    Report Status PENDING  Incomplete  Culture, blood (Routine x 2)     Status: None (Preliminary result)   Collection Time: 09/15/23 11:07 AM   Specimen: BLOOD  Result Value Ref Range Status   Specimen Description   Final    BLOOD RIGHT ANTECUBITAL Performed at [facility], [address] [place], [place] [address]    Special Requests   Final    BOTTLES DRAWN AEROBIC AND ANAEROBIC Blood Culture adequate volume Performed at [facility], [address], [place], [place] [address]    Culture   Final    NO GROWTH 2 DAYS Performed at [facility], [address], [place], [place] [address]    Report Status PENDING  Incomplete  C Difficile Quick Screen w PCR reflex     Status: None   Collection Time: 09/15/23  2:42 PM   Specimen: STOOL  Result Value Ref Range Status   C Diff antigen NEGATIVE NEGATIVE Final   C Diff toxin NEGATIVE NEGATIVE Final   C Diff interpretation No C. difficile detected.  Final    Comment: Performed at [facility], [address] [place], [place] [address]    Today   Subjective    [name] feels much improved since admission.  Feels ready to go home.  Denies chest pain, shortness of breath or abdominal pain.  Feels they can take care of themselves with the resources they have at home.  Objective   Blood pressure 137/78, pulse 85, temperature 97.8 F (36.6 C), temperature source Oral, resp. rate 17, height 5\' 7"  (1.702 m), weight 65 kg, SpO2 98%.   Intake/Output Summary (Last 24 hours) at 09/17/2023 1327 Last data filed at 09/17/2023 1100 Gross per 24 hour  Intake 1123.9 ml  Output 600 ml  Net 523.9 ml    Exam General: Patient appears well and in good spirits sitting up in bed in no acute distress.  Eyes: sclera anicteric, conjuctiva mild injection bilaterally CVS: S1-S2, regular   Respiratory:  decreased air entry bilaterally secondary to decreased inspiratory effort, rales at bases  GI: NABS, soft, NT  LE: No edema.  Neuro: A/O x 3, Moving all extremities equally with normal strength, CN 3-12 intact, grossly nonfocal.  Psych: patient is logical and coherent, judgement and insight appear normal, mood and affect appropriate to situation.    Data Review   CBC w Diff:  Lab Results  Component Value Date   WBC 4.7 09/17/2023   HGB 13.2 09/17/2023   HCT 40.0 09/17/2023   PLT 147 (L) 09/17/2023   LYMPHOPCT 11 09/15/2023   MONOPCT 8 09/15/2023   EOSPCT 0 09/15/2023   BASOPCT 0 09/15/2023    CMP:  Lab Results  Component Value Date   NA 133 (L) 09/17/2023   K 3.6 09/17/2023   CL 101 09/17/2023   CO2 23 09/17/2023   BUN 6 (L) 09/17/2023   CREATININE <0.30 (L) 09/17/2023   PROT 4.9 (L) 09/16/2023   ALBUMIN  2.4 (L) 09/16/2023   BILITOT 1.3 (H) 09/16/2023   ALKPHOS 105 09/16/2023   AST 37 09/16/2023   ALT 21 09/16/2023  .   Total Time in preparing paper work, data evaluation and todays exam - 35 minutes  Nyjai Graff Tublu Tobyn Osgood M.D on 09/17/2023 at 1:27 PM  [facility]

## 2023-09-17 NOTE — Progress Notes (Signed)
 Attempted to flush IV.  IV found hanging on hand with tape.  Ordered IV team for new IV due to lack of space to insert new IV.  Bilateral arms are wrapped.

## 2023-09-17 NOTE — Progress Notes (Signed)
 Pt becoming slightly irritable stating he's ready to go home but he knows he has "3 options."  Pt agreed to stay to get his IV antibiotics

## 2023-09-17 NOTE — Evaluation (Signed)
 Speech Language Pathology Evaluation Patient Details Name: [name] MRN: [id number] DOB: 02-Jan-1946 Today's Date: 09/17/2023 Time: 4098-1191 SLP Time Calculation (min) (ACUTE ONLY): 26 min  Problem List:  Patient Active Problem List   Diagnosis Date Noted   Chronic intracranial subdural hematoma (HCC) 09/15/2023   Hyponatremia 05/28/2011   Thrombocytopenia (HCC) 05/28/2011   Heme positive stool 05/25/2011   Alcoholism (HCC) 05/25/2011   Anxiety disorder 05/25/2011   Severe protein-calorie malnutrition (HCC) 05/25/2011   Past Medical History:  Past Medical History:  Diagnosis Date   Alcoholic cirrhosis of liver with ascites (HCC)    Alcoholism (HCC)    Allergy    spring occasionally   Anxiety disorder    Diverticulosis    Elevated liver function tests    Impaired fasting glucose    Internal hemorrhoids    Tubular adenoma of colon 11/2009   White coat hypertension    Past Surgical History:  Past Surgical History:  Procedure Laterality Date   COLONOSCOPY  11/2009   2011 - 3 adenomas, diverticulosis, hemorhoids   ESOPHAGOGASTRODUODENOSCOPY  05/26/2011   Procedure: ESOPHAGOGASTRODUODENOSCOPY (EGD);  Surgeon: Legrand Puma, MD;  Location[facility] ENDOSCOPY;  Service: Endoscopy;  Laterality: N/A;   HERNIA REPAIR     POLYPECTOMY     UPPER GASTROINTESTINAL ENDOSCOPY     WRIST FRACTURE SURGERY Right    2008 or 09   HPI:  "78 year old man with ongoing alcohol  use, cirrhosis, multiple falls was admitted after presenting with skin lacerations after another fall.  Workup revealed UTI as well as bilateral subdural hematomas.  Patient lives alone." - per MD note.  Cognitive evaluation ordered.  Pt reports he lives alone and manages his own ADLs, financing, etc.   He reports he sees Dr Bernetta Brilliant PCP and has had word finding deficits as of late.   Assessment / Plan / Recommendation Clinical Impression  Pt reports baseline word finding deficits but denies cognitive imipairment.  [facility] Mental Status Examination administered with pt scoring 20/30 points indicative of a significant cognitive deficit per screen authors.  Strengths noted in orientation to location, attention and language. Deficits noted in mental math for subtraction, clock drawing for placement of hands *did not place despite verbal cues and even referring him to the clock on the wall.  He recalled 3/5 words independently, 2/5 with category cue; 3/4 narrative questions answered correctly.  Safety awareness severely impaired.  He declined to eat his meal - despite SLP encouragement - stating he was not hungry.  Pt would benefit from follow up SLP to assist with ADLs, bill management and safety.  He is a high fall risk - and needing cues for safety.  Will follow up acutely for cognitive linguistic treatment to help with rehabilitation.  No family or caregiver present to establish baseline function and pt has known h/o ETOH use - ? baseline function.    SLP Assessment  SLP Recommendation/Assessment: Patient needs continued Speech Lanaguage Pathology Services SLP Visit Diagnosis: Attention and concentration deficit;Cognitive communication deficit (R41.841) Attention and concentration deficit following: Nontraumatic intracerebral hemorrhage    Recommendations for follow up therapy are one component of a multi-disciplinary discharge planning process, led by the attending physician.  Recommendations may be updated based on patient status, additional functional criteria and insurance authorization.    Follow Up Recommendations       Assistance Recommended at Discharge  None  Functional Status Assessment Patient has had a recent decline in their functional status and demonstrates the ability to  make significant improvements in function in a reasonable and predictable amount of time.  Frequency and Duration min 1 x/week  1 week      SLP Evaluation Cognition  Overall Cognitive Status: No family/caregiver present  to determine baseline cognitive functioning Arousal/Alertness: Awake/alert Orientation Level: Oriented to person;Oriented to place;Disoriented to time;Disoriented to situation Year: 2025 Month: May Day of Week: Incorrect (said "Wednesday") Attention: Selective Memory: Impaired (3/5 independently; 2/5 with category cue) Awareness: Impaired Awareness Impairment: Intellectual impairment;Emergent impairment Problem Solving: Impaired Problem Solving Impairment:  (functional math questions, subtraction) Executive Function:  (pt not at this level of care) Behaviors: Impulsive Safety/Judgment: Impaired       Comprehension  Auditory Comprehension Overall Auditory Comprehension: Appears within functional limits for tasks assessed Yes/No Questions: Not tested Commands: Within Functional Limits Conversation: Simple Interfering Components: Processing speed Visual Recognition/Discrimination Discrimination: Not tested Reading Comprehension Reading Status:  (pt read the clock without difficulties but did not read date, reports no glasses used = h/o cataracts)    Expression Expression Primary Mode of Expression: Verbal Verbal Expression Overall Verbal Expression: Impaired Initiation: No impairment Naming: Not tested Pragmatics: No impairment Written Expression Dominant Hand: Right Written Expression: Not tested   Oral / Motor  Oral Motor/Sensory Function Overall Oral Motor/Sensory Function: Other (comment) (appeared with slight facial asymmetry on the left) Motor Speech Overall Motor Speech: Appears within functional limits for tasks assessed Respiration: Within functional limits Phonation: Other (comment) (occasionally wet) Resonance: Within functional limits Articulation: Within functional limitis Intelligibility: Intelligible Motor Planning: Not tested Motor Speech Errors: Not applicable            Chantal Comment 09/17/2023, 1:30 PM  Maudie Sorrow, MS [facility] SLP Acute Rehab  Services Office [phone]

## 2023-09-17 NOTE — Plan of Care (Signed)
  Problem: Education: Goal: Knowledge of General Education information will improve Description: Including pain rating scale, medication(s)/side effects and non-pharmacologic comfort measures Outcome: Adequate for Discharge   Problem: Health Behavior/Discharge Planning: Goal: Ability to manage health-related needs will improve Outcome: Adequate for Discharge   Problem: Clinical Measurements: Goal: Ability to maintain clinical measurements within normal limits will improve Outcome: Adequate for Discharge Goal: Will remain free from infection Outcome: Adequate for Discharge Goal: Diagnostic test results will improve Outcome: Adequate for Discharge Goal: Respiratory complications will improve Outcome: Adequate for Discharge Goal: Cardiovascular complication will be avoided Outcome: Adequate for Discharge   Problem: Activity: Goal: Risk for activity intolerance will decrease Outcome: Adequate for Discharge   Problem: Nutrition: Goal: Adequate nutrition will be maintained Outcome: Adequate for Discharge   Problem: Coping: Goal: Level of anxiety will decrease Outcome: Adequate for Discharge   Problem: Elimination: Goal: Will not experience complications related to bowel motility Outcome: Adequate for Discharge Goal: Will not experience complications related to urinary retention Outcome: Adequate for Discharge   Problem: Pain Managment: Goal: General experience of comfort will improve and/or be controlled Outcome: Adequate for Discharge   Problem: Skin Integrity: Goal: Risk for impaired skin integrity will decrease Outcome: Adequate for Discharge

## 2023-09-17 NOTE — Progress Notes (Addendum)
 Occupational Therapy Treatment Patient Details Name: [name] MRN: [id number] DOB: 12-22-1945 Today's Date: 09/17/2023   History of present illness Pt presents with Bil arm abrasions due to a recent fall at home and intracranial subdural hemotoma thought to be from another fall a few months prior. Pt with PMH to include anxiety, ETOH, and cirrosis of the liver.   OT comments  Pt seen for OT tx this date, received in recliner. Removes dentures seated, and requires CGA overall for functional transfers and mobility 40 ft in hallway using RW. Pt with noted impairments in cognition and executive functioning with decreased reasoning and problem solving during functional task performance. OT asked pt multiple times if he needed to use bathroom prior to returning to bed with pt declining. Upon immediate return to supine, pt voices need to void. Attempts to climb over bedrails OOB to R despite rails lowered on L, CGA for t/f bathroom for standing urine void and requires cues to locate handle to flush toilet. Pt remains high falls risk. Left in bed with needs in reach, call bell in hand. Recommending 24/7 support and assistance upon discharge for safety due to cognitive concerns. Patient will benefit from continued inpatient follow up therapy, <3 hours/day.       If plan is discharge home, recommend the following:  A little help with walking and/or transfers;A little help with bathing/dressing/bathroom;Assist for transportation;Assistance with cooking/housework;Help with stairs or ramp for entrance   Equipment Recommendations  None recommended by OT       Precautions / Restrictions Precautions Precautions: Fall Recall of Precautions/Restrictions: Impaired Restrictions Weight Bearing Restrictions Per Provider Order: No       Mobility Bed Mobility Overal bed mobility: Needs Assistance Bed Mobility: Supine to Sit, Sit to Supine     Supine to sit: Contact guard, Used rails, HOB elevated Sit  to supine: Supervision   General bed mobility comments: pt returned to supine, then voiced need to void, attempting to climb over bed rails despite rail lowered on L. cues to redirect    Transfers Overall transfer level: Needs assistance Equipment used: Rolling walker (2 wheels) Transfers: Sit to/from Stand Sit to Stand: Contact guard assist           General transfer comment: steady assist for balance     Balance Overall balance assessment: Needs assistance Sitting-balance support: No upper extremity supported, Feet supported Sitting balance-Leahy Scale: Good Sitting balance - Comments: static sitting-good. dynamic sitting-fair+     Standing balance-Leahy Scale: Poor Standing balance comment: CGA to min assist with RW                           ADL either performed or assessed with clinical judgement   ADL Overall ADL's : Needs assistance/impaired     Grooming: Oral care;Sitting;Set up Grooming Details (indicate cue type and reason): pt removes dentures and placed in containter                 Toilet Transfer: Contact guard assist;Rolling walker (2 wheels);Ambulation;Regular Teacher, adult education Details (indicate cue type and reason): step pivot using RW, cues for safety and IV awareness Toileting- Clothing Manipulation and Hygiene: Contact guard assist;Sit to/from stand;Cueing for safety Toileting - Clothing Manipulation Details (indicate cue type and reason): stands for urine void, able to put up toilet seat without LOB but cannot locate handle to flush toilet without cues (despite hand already on lever)     Functional mobility during  ADLs: Contact guard assist;Cueing for sequencing;Cueing for safety;Rolling walker (2 wheels) (40 ft in hallway) General ADL Comments: Removes dentures seated in recliner, 40 ft in hallway using RW functional mobility, t/f bathroom for standing void.     Communication Communication Communication: No apparent  difficulties   Cognition Arousal: Alert Behavior During Therapy: WFL for tasks assessed/performed Cognition: Cognition impaired   Orientation impairments: Situation Awareness: Intellectual awareness impaired Memory impairment (select all impairments): Short-term memory, Working [employer] fast streamer, Non-declarative long-term memory Attention impairment (select first level of impairment): Sustained attention Executive functioning impairment (select all impairments): Organization, Sequencing, Reasoning, Problem solving OT - Cognition Comments: Pt unaware of situation, states bruising on BUE/LE was from cat. Poor insight into minor balance deficits, and impaired environmental awareness. Pt could not locate handle to flush toilet without cues, attempted to climb out of bed over bed rail, requiring cuing to redirect for safety                 Following commands: Impaired Following commands impaired: Follows one step commands with increased time, Follows multi-step commands inconsistently      Cueing   Cueing Techniques: Verbal cues, Tactile cues             Pertinent Vitals/ Pain       Pain Assessment Pain Assessment: No/denies pain  Home Living     Available Help at Discharge: Available PRN/intermittently Type of Home: House                              Lives With: Other (Comment)        Frequency  Min 2X/week        Progress Toward Goals  OT Goals(current goals can now be found in the care plan section)  Progress towards OT goals: Progressing toward goals  Acute Rehab OT Goals OT Goal Formulation: With patient Time For Goal Achievement: 09/30/23 Potential to Achieve Goals: Good ADL Goals Pt Will Perform Grooming: with modified independence;standing Pt Will Perform Lower Body Dressing: with modified independence;sitting/lateral leans;sit to/from stand Pt Will Transfer to Toilet: with modified independence;ambulating Pt Will Perform Toileting - Clothing  Manipulation and hygiene: with modified independence;sit to/from stand  Plan         AM-PAC OT "6 Clicks" Daily Activity     Outcome Measure   Help from another person eating meals?: None Help from another person taking care of personal grooming?: A Little Help from another person toileting, which includes using toliet, bedpan, or urinal?: A Little Help from another person bathing (including washing, rinsing, drying)?: A Little Help from another person to put on and taking off regular upper body clothing?: A Little Help from another person to put on and taking off regular lower body clothing?: A Lot 6 Click Score: 18    End of Session Equipment Utilized During Treatment: Rolling walker (2 wheels)  OT Visit Diagnosis: Unsteadiness on feet (R26.81);History of falling (Z91.81)   Activity Tolerance Patient tolerated treatment well   Patient Left in bed;with call bell/phone within reach;with bed alarm set   Nurse Communication Mobility status        Time: 1610-9604 OT Time Calculation (min): 27 min  Charges: OT General Charges $OT Visit: 1 Visit OT Treatments $Self Care/Home Management : 23-37 mins  Nivin Braniff L. Fayne Mcguffee, OTR/L  09/17/23, 1:33 PM

## 2023-09-17 NOTE — TOC Transition Note (Signed)
 Transition of Care [facility]) - Discharge Note   Patient Details  Name: [name] MRN: [id number] Date of Birth: 1945/05/07  [facility]) CM/SW Contact:  Kathryn Parish, RN Phone Number: 09/17/2023, 2:37 PM   Clinical Narrative:    NCM spoke with patient in the room. Presented with choice. [facility] accepted patient. Information added to AVS. TOC signing off.    Final next level of care: Home w Home Health Services Barriers to Discharge: Barriers Resolved   Patient Goals and CMS Choice     Choice offered to / list presented to : NA      Discharge Placement                       Discharge Plan and Services Additional resources added to the After Visit Summary for     Discharge Planning Services: CM Consult Post Acute Care Choice: NA          DME Arranged: N/A DME Agency: NA       HH Arranged: PT HH Agency: [facility] Date [facility] Agency Contacted: 09/17/23 Time HH Agency Contacted: 1430 Representative spoke with at [facility] Agency: Carmelina Chinchilla  Social Drivers of Health (SDOH) Interventions SDOH Screenings   Food Insecurity: No Food Insecurity (09/15/2023)  Housing: Low Risk  (09/15/2023)  Transportation Needs: No Transportation Needs (09/15/2023)  Utilities: Not At Risk (09/15/2023)  Social Connections: Moderately Isolated (09/15/2023)  Tobacco Use: High Risk (09/16/2023)     Readmission Risk Interventions     No data to display

## 2023-09-17 NOTE — Progress Notes (Signed)
 Physical Therapy Treatment Patient Details Name: [name] MRN: [id number] DOB: 1945-07-31 Today's Date: 09/17/2023   History of Present Illness Pt presents with Bil arm abrasions due to a recent fall at home and intracranial subdural hemotoma thought to be from another fall a few months prior. Pt with PMH to include anxiety, ETOH, and cirrosis of the liver.    PT Comments  Pt agreeable to session. Pt able to amb x468ft with RW at supervision A, improved tolerance and dec stiffness reported as dist progresses. Pt able to negotiate 10 steps with bil HR use to simulate home entry/exit. Pt does show signs of instability initially which does get better, discussed home support which he offers dtr can come stay with him 24 hours "indefinitely" to assist prn.     If plan is discharge home, recommend the following: A little help with walking and/or transfers;Assistance with cooking/housework;Assist for transportation;Help with stairs or ramp for entrance   Can travel by private vehicle     Yes  Equipment Recommendations  None recommended by PT    Recommendations for Other Services       Precautions / Restrictions Precautions Precautions: Fall Recall of Precautions/Restrictions: Intact Restrictions Weight Bearing Restrictions Per Provider Order: No     Mobility  Bed Mobility Overal bed mobility: Needs Assistance Bed Mobility: Supine to Sit, Sit to Supine     Supine to sit: Supervision Sit to supine: Supervision        Transfers Overall transfer level: Modified independent Equipment used: Rolling walker (2 wheels) Transfers: Sit to/from Stand Sit to Stand: Modified independent (Device/Increase time)                Ambulation/Gait Ambulation/Gait assistance: Supervision Gait Distance (Feet): 450 Feet Assistive device: Rolling walker (2 wheels) Gait Pattern/deviations: Decreased stride length, Step-through pattern, Narrow base of support Gait velocity: dec      General Gait Details: Pt able to amb x463ft around the unit, quality of gait improves with decreased UE reliance on walker and improved gait line as distance progresses.   Stairs Stairs: Yes Stairs assistance: Supervision Stair Management: Two rails, Alternating pattern Number of Stairs: 10 General stair comments: home setup 7 steps with Bil HR, able to ascend and descend using reciprocal pattern and BUE HHA on rails   Wheelchair Mobility     Tilt Bed    Modified Rankin (Stroke Patients Only)       Balance Overall balance assessment: Needs assistance Sitting-balance support: No upper extremity supported, Feet supported Sitting balance-Leahy Scale: Good     Standing balance support: No upper extremity supported, During functional activity Standing balance-Leahy Scale: Fair                              Hotel manager: No apparent difficulties  Cognition Arousal: Alert Behavior During Therapy: WFL for tasks assessed/performed   PT - Cognitive impairments: No apparent impairments                         Following commands: Intact      Cueing Cueing Techniques: Verbal cues, Tactile cues  Exercises      General Comments        Pertinent Vitals/Pain Pain Assessment Pain Assessment: No/denies pain    Home Living     Available Help at Discharge: Available PRN/intermittently Type of Home: House  Prior Function            PT Goals (current goals can now be found in the care plan section) Acute Rehab PT Goals Patient Stated Goal: to return home PT Goal Formulation: With patient Time For Goal Achievement: 09/30/23 Potential to Achieve Goals: Good Progress towards PT goals: Progressing toward goals    Frequency    Min 3X/week      PT Plan      Co-evaluation              AM-PAC PT "6 Clicks" Mobility   Outcome Measure  Help needed turning from your back to your side  while in a flat bed without using bedrails?: None Help needed moving from lying on your back to sitting on the side of a flat bed without using bedrails?: None Help needed moving to and from a bed to a chair (including a wheelchair)?: A Little Help needed standing up from a chair using your arms (e.g., wheelchair or bedside chair)?: None Help needed to walk in hospital room?: A Little Help needed climbing 3-5 steps with a railing? : A Little 6 Click Score: 21    End of Session Equipment Utilized During Treatment: Gait belt Activity Tolerance: Patient tolerated treatment well Patient left: in bed;with call bell/phone within reach;with bed alarm set Nurse Communication: Mobility status PT Visit Diagnosis: Difficulty in walking, not elsewhere classified (R26.2);Muscle weakness (generalized) (M62.81);Repeated falls (R29.6);Unsteadiness on feet (R26.81)     Time: 1610-9604 PT Time Calculation (min) (ACUTE ONLY): 33 min  Charges:    $Gait Training: 23-37 mins PT General Charges $$ ACUTE PT VISIT: 1 Visit                     Darien Eden, PT Acute Rehabilitation Services Office: [phone] 09/17/2023    [name] 09/17/2023, 1:53 PM

## 2023-09-20 LAB — CULTURE, BLOOD (ROUTINE X 2)
Culture: NO GROWTH
Culture: NO GROWTH
Special Requests: ADEQUATE
Special Requests: ADEQUATE

## 2023-09-24 DIAGNOSIS — Z1389 Encounter for screening for other disorder: Secondary | ICD-10-CM | POA: Diagnosis not present

## 2023-09-24 DIAGNOSIS — N3090 Cystitis, unspecified without hematuria: Secondary | ICD-10-CM | POA: Diagnosis not present

## 2023-09-24 DIAGNOSIS — E876 Hypokalemia: Secondary | ICD-10-CM | POA: Diagnosis not present

## 2023-10-20 ENCOUNTER — Ambulatory Visit: Admitting: Gastroenterology

## 2024-03-12 ENCOUNTER — Emergency Department (HOSPITAL_COMMUNITY)
Admission: EM | Admit: 2024-03-12 | Discharge: 2024-04-04 | Disposition: E | Attending: Emergency Medicine | Admitting: Emergency Medicine

## 2024-03-12 DIAGNOSIS — M7989 Other specified soft tissue disorders: Secondary | ICD-10-CM | POA: Diagnosis not present

## 2024-03-12 DIAGNOSIS — R682 Dry mouth, unspecified: Secondary | ICD-10-CM | POA: Diagnosis not present

## 2024-03-12 DIAGNOSIS — T148XXA Other injury of unspecified body region, initial encounter: Secondary | ICD-10-CM | POA: Diagnosis not present

## 2024-03-12 DIAGNOSIS — F17200 Nicotine dependence, unspecified, uncomplicated: Secondary | ICD-10-CM | POA: Insufficient documentation

## 2024-03-12 DIAGNOSIS — R404 Transient alteration of awareness: Secondary | ICD-10-CM | POA: Diagnosis not present

## 2024-03-12 DIAGNOSIS — I499 Cardiac arrhythmia, unspecified: Secondary | ICD-10-CM | POA: Diagnosis not present

## 2024-03-12 DIAGNOSIS — I469 Cardiac arrest, cause unspecified: Secondary | ICD-10-CM | POA: Insufficient documentation

## 2024-03-12 DIAGNOSIS — X58XXXA Exposure to other specified factors, initial encounter: Secondary | ICD-10-CM | POA: Insufficient documentation

## 2024-03-12 DIAGNOSIS — R55 Syncope and collapse: Secondary | ICD-10-CM | POA: Insufficient documentation

## 2024-04-04 NOTE — ED Notes (Signed)
 CPR paused. Dr. Dean at bedside with ultrasound to assess cardiac activity. No activity noted. No pulse.

## 2024-04-04 NOTE — ED Notes (Signed)
 Time of death called by Dr. Dean.

## 2024-04-04 NOTE — ED Provider Notes (Signed)
 [place] EMERGENCY DEPARTMENT AT [facility] Provider Note   CSN: [id number] Arrival date & time: 03-25-24  9149     Patient presents with: Cardiac Arrest   [name] is a 78 y.o. male.   Pt is a 78 yo male with pmhx significant for alcohol  and tobacco abuse and hx SDH.  Pt lives alone.  He was found outside this morning by a neighbor.  It is unclear how long he was down.  He was unresponsive for EMS and CPR was started at 0758.  He was intubated and had multiple rounds of epi (8), had 6 shocks, as well as amiodarone and D10.  Pt had no response.  CPR via [facility] upon arrival.       Prior to Admission medications   Not on File    Allergies: Duloxetine hcl, Escitalopram, Paroxetine, Sertraline, and Triamcinolone acetonide    Review of Systems  Unable to perform ROS: Patient unresponsive    Updated Vital Signs There were no vitals taken for this visit.  Physical Exam Vitals and nursing note reviewed.  Constitutional:      Interventions: He is intubated. Backboard in place.     Comments: Mottled, cold, unresponsive  HENT:     Right Ear: External ear normal.     Left Ear: External ear normal.     Nose: Nose normal.     Mouth/Throat:     Mouth: Mucous membranes are dry.     Comments: Orally intubated Eyes:     Comments: Pupils unresponsive  Neck:     Comments: No obv trauma Cardiovascular:     Comments: No HR Pulmonary:     Effort: He is intubated.     Comments: Good BS with bagging; no spontaneous bs Abdominal:     General: Abdomen is flat.  Musculoskeletal:     Comments: Swelling to knees bilat  Skin:    Comments: Pt is mottled; multiple skin tears;     (all labs ordered are listed, but only abnormal results are displayed) Labs Reviewed - No data to display  EKG: None  Radiology: No results found.   Procedures   Medications Ordered in the ED - No data to display                                  Medical Decision Making  This  patient presents to the ED for concern of full arrest, this involves an extensive number of treatment options, and is a complaint that carries with it a high risk of complications and morbidity.  The differential diagnosis includes mi, pe, resp failure, electrolyte abn, trauma   Co morbidities that complicate the patient evaluation  alcohol  and tobacco abuse and hx SDH   Additional history obtained:  Additional history obtained from epic chart review External records from outside source obtained and reviewed including EMS report   Cardiac Monitoring:  The patient was maintained on a cardiac monitor.  I personally viewed and interpreted the cardiac monitored which showed an underlying rhythm of: asystole   Medicines ordered and prescription drug management:   I have reviewed the patients home medicines and have made adjustments as needed  Problem List / ED Course:  Cardiac arrest:  pt has been down for a known time of 1 hr (likely longer than that).  He has not had any response to ACLS treatment by EMS.  I did a bedside US   of his heart and he had no organized cardiac activity.  Pupils are unresponsive.  Due to all this, I did not feel that further interventions would be helpful. Ttime of death called at 26.  As we don't know the circumstances surrounding this event, I spoke with the medical examiner, Lewis.  He will take him as a patient.  Pt's daughter [name]) notified via phone.   Reevaluation:  After the interventions noted above, I reevaluated the patient and found that they have :improved   Social Determinants of Health:  Lives alone   Dispostion:  Morgue      Final diagnoses:  Cardiac arrest [facility])    ED Discharge Orders     None          Dean Clarity, MD 03-16-24 [phone]

## 2024-04-04 NOTE — ED Triage Notes (Signed)
 Pt BIB EMS after found down in front yard by neighbor. EMS started CPR at 0758. Unknown downtime, pt given 300 amio, 6 shocks, 8 epi, and D10 after CBG 30. Pt arrives CPR in progress with lucas going. Pt mottled, abrasions and skin tears all over body.

## 2024-04-04 DEATH — deceased
